# Patient Record
Sex: Female | Born: 1955 | Race: White | Hispanic: No | Smoking: Never smoker
Health system: Southern US, Community
[De-identification: ages and names within clinical notes are randomized; demographics above are authoritative.]

## PROBLEM LIST (undated history)

## (undated) DIAGNOSIS — T7840XA Allergy, unspecified, initial encounter: Secondary | ICD-10-CM

## (undated) DIAGNOSIS — M1990 Unspecified osteoarthritis, unspecified site: Secondary | ICD-10-CM

## (undated) DIAGNOSIS — H269 Unspecified cataract: Secondary | ICD-10-CM

## (undated) DIAGNOSIS — K219 Gastro-esophageal reflux disease without esophagitis: Secondary | ICD-10-CM

## (undated) DIAGNOSIS — I10 Essential (primary) hypertension: Secondary | ICD-10-CM

## (undated) DIAGNOSIS — G709 Myoneural disorder, unspecified: Secondary | ICD-10-CM

## (undated) DIAGNOSIS — E119 Type 2 diabetes mellitus without complications: Secondary | ICD-10-CM

## (undated) DIAGNOSIS — F32A Depression, unspecified: Secondary | ICD-10-CM

## (undated) HISTORY — DX: Myoneural disorder, unspecified: G70.9

## (undated) HISTORY — PX: TOTAL ABDOMINAL HYSTERECTOMY: SHX209

## (undated) HISTORY — DX: Depression, unspecified: F32.A

## (undated) HISTORY — DX: Gastro-esophageal reflux disease without esophagitis: K21.9

## (undated) HISTORY — PX: TUBAL LIGATION: SHX77

## (undated) HISTORY — DX: Unspecified cataract: H26.9

## (undated) HISTORY — PX: CHOLECYSTECTOMY: SHX55

## (undated) HISTORY — PX: CATARACT EXTRACTION: SUR2

## (undated) HISTORY — DX: Unspecified osteoarthritis, unspecified site: M19.90

## (undated) HISTORY — DX: Allergy, unspecified, initial encounter: T78.40XA

## (undated) HISTORY — PX: ABDOMINAL HYSTERECTOMY: SHX81

---

## 2012-10-13 ENCOUNTER — Emergency Department (HOSPITAL_COMMUNITY)
Admission: EM | Admit: 2012-10-13 | Discharge: 2012-10-13 | Disposition: A | Discharge status: Home / Self Care | Payer: No Typology Code available for payment source | Attending: Emergency Medicine | Admitting: Emergency Medicine

## 2012-10-13 ENCOUNTER — Emergency Department (HOSPITAL_COMMUNITY): Payer: No Typology Code available for payment source

## 2012-10-13 ENCOUNTER — Encounter (HOSPITAL_COMMUNITY): Payer: Self-pay | Admitting: Cardiology

## 2012-10-13 DIAGNOSIS — I10 Essential (primary) hypertension: Secondary | ICD-10-CM | POA: Insufficient documentation

## 2012-10-13 DIAGNOSIS — IMO0002 Reserved for concepts with insufficient information to code with codable children: Secondary | ICD-10-CM | POA: Insufficient documentation

## 2012-10-13 DIAGNOSIS — Z79899 Other long term (current) drug therapy: Secondary | ICD-10-CM | POA: Insufficient documentation

## 2012-10-13 DIAGNOSIS — E119 Type 2 diabetes mellitus without complications: Secondary | ICD-10-CM | POA: Insufficient documentation

## 2012-10-13 DIAGNOSIS — S62501A Fracture of unspecified phalanx of right thumb, initial encounter for closed fracture: Secondary | ICD-10-CM

## 2012-10-13 DIAGNOSIS — Y9389 Activity, other specified: Secondary | ICD-10-CM | POA: Insufficient documentation

## 2012-10-13 DIAGNOSIS — S3981XA Other specified injuries of abdomen, initial encounter: Secondary | ICD-10-CM | POA: Insufficient documentation

## 2012-10-13 DIAGNOSIS — S62609A Fracture of unspecified phalanx of unspecified finger, initial encounter for closed fracture: Secondary | ICD-10-CM | POA: Insufficient documentation

## 2012-10-13 DIAGNOSIS — Y92410 Unspecified street and highway as the place of occurrence of the external cause: Secondary | ICD-10-CM | POA: Insufficient documentation

## 2012-10-13 HISTORY — DX: Type 2 diabetes mellitus without complications: E11.9

## 2012-10-13 HISTORY — DX: Essential (primary) hypertension: I10

## 2012-10-13 LAB — CBC WITH DIFFERENTIAL/PLATELET
Basophils Absolute: 0 10*3/uL (ref 0.0–0.1)
Basophils Relative: 0 % (ref 0–1)
MCHC: 34.4 g/dL (ref 30.0–36.0)
Neutro Abs: 8.2 10*3/uL — ABNORMAL HIGH (ref 1.7–7.7)
Neutrophils Relative %: 80 % — ABNORMAL HIGH (ref 43–77)
RDW: 13 % (ref 11.5–15.5)

## 2012-10-13 LAB — BASIC METABOLIC PANEL
Chloride: 104 mEq/L (ref 96–112)
GFR calc Af Amer: >90 mL/min (ref >90)
Potassium: 4.1 mEq/L (ref 3.5–5.1)

## 2012-10-13 MED ORDER — MORPHINE SULFATE 4 MG/ML IJ SOLN
4.0000 mg | Freq: Once | INTRAMUSCULAR | Status: DC
  Filled 2012-10-13 (×2): qty 1

## 2012-10-13 MED ORDER — IOHEXOL 300 MG/ML  SOLN
100.0000 mL | Freq: Once | INTRAMUSCULAR | Status: AC | PRN
  Administered 2012-10-13: 100 mL via INTRAVENOUS

## 2012-10-13 MED ORDER — OXYCODONE-ACETAMINOPHEN 5-325 MG PO TABS: 1.0000 | ORAL_TABLET | ORAL | Status: DC | PRN

## 2012-10-13 MED ORDER — HYDROCODONE-ACETAMINOPHEN 5-325 MG PO TABS
1.0000 | ORAL_TABLET | Freq: Once | ORAL | Status: AC
  Administered 2012-10-13: 1 via ORAL
  Filled 2012-10-13: qty 1
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
## 2012-10-13 NOTE — ED Notes (Signed)
Family at bedside. 
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
## 2012-10-13 NOTE — Progress Notes (Signed)
Orthopedic Tech Progress Note Patient Details:  Alicia Pineda 1956/03/11 [id number]  Ortho Devices Type of Ortho Device: Ace wrap;Thumb spica splint Splint Material: Plaster Ortho Device/Splint Location: RUE Ortho Device/Splint Interventions: Ordered;Application   Jennye Moccasin 10/13/2012, 8:55 PM

## 2012-10-15 NOTE — ED Provider Notes (Signed)
Medical screening examination/treatment/procedure(s) were performed by non-physician practitioner and as supervising physician I was immediately available for consultation/collaboration.  Juliet Rude. Rubin Payor, MD 10/15/12 1400

## 2012-12-07 ENCOUNTER — Ambulatory Visit: Payer: No Typology Code available for payment source | Attending: Orthopedic Surgery | Admitting: Physical Therapy

## 2012-12-07 DIAGNOSIS — R5381 Other malaise: Secondary | ICD-10-CM | POA: Insufficient documentation

## 2012-12-07 DIAGNOSIS — M25549 Pain in joints of unspecified hand: Secondary | ICD-10-CM | POA: Insufficient documentation

## 2012-12-07 DIAGNOSIS — IMO0001 Reserved for inherently not codable concepts without codable children: Secondary | ICD-10-CM | POA: Insufficient documentation

## 2012-12-08 ENCOUNTER — Ambulatory Visit: Payer: No Typology Code available for payment source | Admitting: Physical Therapy

## 2012-12-12 ENCOUNTER — Ambulatory Visit: Payer: No Typology Code available for payment source

## 2012-12-15 ENCOUNTER — Ambulatory Visit: Payer: No Typology Code available for payment source

## 2012-12-19 ENCOUNTER — Ambulatory Visit: Payer: No Typology Code available for payment source

## 2012-12-21 ENCOUNTER — Ambulatory Visit: Payer: No Typology Code available for payment source

## 2012-12-27 ENCOUNTER — Encounter: Payer: Self-pay | Admitting: *Deleted

## 2012-12-29 ENCOUNTER — Encounter: Payer: Self-pay | Admitting: *Deleted

## 2017-11-09 DIAGNOSIS — K819 Cholecystitis, unspecified: Secondary | ICD-10-CM | POA: Insufficient documentation

## 2020-04-06 DIAGNOSIS — U071 COVID-19: Secondary | ICD-10-CM

## 2020-04-06 HISTORY — DX: COVID-19: U07.1

## 2020-09-11 DIAGNOSIS — H524 Presbyopia: Secondary | ICD-10-CM | POA: Diagnosis not present

## 2020-09-11 DIAGNOSIS — E119 Type 2 diabetes mellitus without complications: Secondary | ICD-10-CM | POA: Diagnosis not present

## 2020-09-15 ENCOUNTER — Other Ambulatory Visit: Payer: Self-pay

## 2020-09-15 ENCOUNTER — Emergency Department
Admission: EM | Admit: 2020-09-15 | Discharge: 2020-09-15 | Disposition: A | Discharge status: Home / Self Care | Payer: Self-pay | Source: Home / Self Care

## 2020-09-15 ENCOUNTER — Encounter: Payer: Self-pay | Admitting: Emergency Medicine

## 2020-09-15 DIAGNOSIS — B349 Viral infection, unspecified: Secondary | ICD-10-CM

## 2020-09-15 DIAGNOSIS — J0100 Acute maxillary sinusitis, unspecified: Secondary | ICD-10-CM | POA: Diagnosis not present

## 2020-09-15 DIAGNOSIS — J309 Allergic rhinitis, unspecified: Secondary | ICD-10-CM

## 2020-09-15 DIAGNOSIS — R6889 Other general symptoms and signs: Secondary | ICD-10-CM

## 2020-09-15 DIAGNOSIS — J040 Acute laryngitis: Secondary | ICD-10-CM

## 2020-09-15 LAB — POC SARS CORONAVIRUS 2 AG -  ED: SARS Coronavirus 2 Ag: NEGATIVE

## 2020-09-15 MED ORDER — FEXOFENADINE HCL 180 MG PO TABS: 180.0000 mg | ORAL_TABLET | Freq: Every day | ORAL | 0 refills | Status: DC

## 2020-09-15 MED ORDER — METHYLPREDNISOLONE ACETATE 80 MG/ML IJ SUSP
80.0000 mg | Freq: Once | INTRAMUSCULAR | Status: AC
  Administered 2020-09-15: 80 mg via INTRAMUSCULAR

## 2020-09-15 MED ORDER — AMOXICILLIN-POT CLAVULANATE 875-125 MG PO TABS: 1.0000 | ORAL_TABLET | Freq: Two times a day (BID) | ORAL | 0 refills | Status: DC

## 2020-09-15 MED ORDER — PREDNISONE 20 MG PO TABS: ORAL_TABLET | ORAL | 0 refills | Status: DC

## 2020-09-15 NOTE — Discharge Instructions (Addendum)
Advised/instructed patient to take medication as directed with food to completion.  Advised patient not to start oral prednisone burst until tomorrow, Monday, 09/16/2020.  Advised patient may take Allegra 180 mg daily for the next 5 to 7 days, then as needed.  Advised patient to maintain/increase daily water intake (32-48) ounces daily.

## 2020-09-15 NOTE — ED Provider Notes (Signed)
[facility]    CSN: [id number] Arrival date & time: 09/15/20  1039      History   Chief Complaint Chief Complaint  Patient presents with   Sore Throat   Generalized Body Aches    HPI Alicia Pineda is a 65 y.o. female.   HPI 65 year old female presents with body aches and sore throat for 2 days.  Reports negative home COVID test x2.  Patient has been vaccinated for COVID-19.  Past Medical History:  Diagnosis Date   COVID-19 04/2020   also had in 2020   Diabetes mellitus without complication (HCC)    Hypertension     There are no problems to display for this patient.   Past Surgical History:  Procedure Laterality Date   ABDOMINAL HYSTERECTOMY     2007   CHOLECYSTECTOMY      OB History   No obstetric history on file.      Home Medications    Prior to Admission medications   Medication Sig Start Date End Date Taking? Authorizing Provider  amoxicillin-clavulanate (AUGMENTIN) 875-125 MG tablet Take 1 tablet by mouth every 12 (twelve) hours. 09/15/20  Yes Trevor Iha, FNP  carvedilol (COREG) 25 MG tablet Take 25 mg by mouth 2 (two) times daily with a meal.   Yes [provider]  fexofenadine (ALLEGRA ALLERGY) 180 MG tablet Take 1 tablet (180 mg total) by mouth daily for 15 days. 09/15/20 09/30/20 Yes Trevor Iha, FNP  losartan (COZAAR) 50 MG tablet Take 1 tablet by mouth daily. 06/18/20  Yes [provider]  metFORMIN (GLUCOPHAGE) 500 MG tablet Take 500 mg by mouth 2 (two) times daily with a meal.   Yes [provider]  predniSONE (DELTASONE) 20 MG tablet Take 3 tabs PO daily x 5 days. 09/15/20  Yes Trevor Iha, FNP  amLODipine (NORVASC) 10 MG tablet Take 1 tablet by mouth daily. 08/21/20   [provider]  CALCIUM PO Take 1 tablet by mouth daily. Patient not taking: Reported on 09/15/2020    [provider]  fish oil-omega-3 fatty acids 1000 MG capsule Take 1 g by mouth daily.    [provider]   FLUoxetine (PROZAC) 20 MG capsule Take 20 mg by mouth daily. Patient not taking: Reported on 09/15/2020    [provider]  FLUoxetine (PROZAC) 40 MG capsule Take 40 mg by mouth daily. 08/21/20   [provider]  glipiZIDE (GLUCOTROL) 10 MG tablet Take 10 mg by mouth 2 (two) times daily. 08/21/20   [provider]  hydrochlorothiazide (HYDRODIURIL) 25 MG tablet Take by mouth.    [provider]  losartan (COZAAR) 25 MG tablet Take 25 mg by mouth daily. Patient not taking: Reported on 09/15/2020    [provider]  oxyCODONE-acetaminophen (PERCOCET/ROXICET) 5-325 MG per tablet Take 1 tablet by mouth every 4 (four) hours as needed for pain. Patient not taking: Reported on 09/15/2020 10/13/12   Garlon Hatchet, PA-C    Family History Family History  Problem Relation Age of Onset   Heart failure Mother    Diabetes Mother    Heart failure Father    Diabetes Father    Diabetes Sister    Heart failure Brother     Social History Social History   Tobacco Use   Smoking status: Never  Substance Use Topics   Alcohol use: No   Drug use: No     Allergies   Patient has no known allergies.   Review of  Systems Review of Systems  Constitutional:  Positive for fever.  HENT:  Positive for congestion, postnasal drip, sore throat and voice change.   Eyes: Negative.   Respiratory:  Positive for cough.   Cardiovascular: Negative.   Gastrointestinal: Negative.   Genitourinary: Negative.   Musculoskeletal:  Positive for myalgias.  Skin: Negative.   Neurological: Negative.     Physical Exam Triage Vital Signs ED Triage Vitals  Enc Vitals Group     BP 09/15/20 1107 123/74     Pulse Rate 09/15/20 1107 69     Resp 09/15/20 1107 18     Temp 09/15/20 1107 99.1 F (37.3 C)     Temp Source 09/15/20 1107 Oral     SpO2 09/15/20 1107 96 %     Weight 09/15/20 1109 250 lb (113.4 kg)     Height 09/15/20 1109 5\' 5"  (1.651 m)     Head Circumference --       Peak Flow --      Pain Score 09/15/20 1109 4     Pain Loc --      Pain Edu? --      Excl. in GC? --    No data found.  Updated Vital Signs BP 123/74 (BP Location: Right Arm)   Pulse 69   Temp 99.1 F (37.3 C) (Oral)   Resp 18   Ht 5\' 5"  (1.651 m)   Wt 250 lb (113.4 kg)   SpO2 96%   BMI 41.60 kg/m   Physical Exam Constitutional:      General: She is not in acute distress.    Appearance: She is well-developed. She is ill-appearing.  HENT:     Head: Normocephalic and atraumatic.     Right Ear: Tympanic membrane and ear canal normal.     Left Ear: Tympanic membrane and ear canal normal.     Ears:     Comments: Eustachian tube dysfunction noted bilaterally    Nose: Congestion and rhinorrhea present.     Comments: Turbinates are erythematous with scant mucopurulent rhinorrhea noted    Mouth/Throat:     Mouth: Mucous membranes are moist. No oral lesions.     Pharynx: Oropharynx is clear. Uvula midline. No posterior oropharyngeal erythema.     Comments: Moderate amount of clear drainage of posterior oropharynx noted Eyes:     Conjunctiva/sclera: Conjunctivae normal.     Pupils: Pupils are equal, round, and reactive to light.  Cardiovascular:     Rate and Rhythm: Normal rate and regular rhythm.     Heart sounds: Normal heart sounds. No murmur heard. Pulmonary:     Effort: Pulmonary effort is normal. No respiratory distress.     Breath sounds: Normal breath sounds. No wheezing, rhonchi or rales.  Musculoskeletal:     Cervical back: Normal range of motion and neck supple.  Lymphadenopathy:     Cervical: Cervical adenopathy present.  Skin:    General: Skin is warm and dry.  Neurological:     General: No focal deficit present.     Mental Status: She is alert and oriented to person, place, and time.  Psychiatric:        Mood and Affect: Mood normal.        Behavior: Behavior normal.     UC Treatments / Results  Labs (all labs ordered are listed, but only abnormal  results are displayed) Labs Reviewed  POC SARS CORONAVIRUS 2 AG -  ED    EKG   Radiology No  results found.  Procedures Procedures (including critical care time)  Medications Ordered in UC Medications  methylPREDNISolone acetate (DEPO-MEDROL) injection 80 mg (80 mg Intramuscular Given 09/15/20 1209)    Initial Impression / Assessment and Plan / UC Course  I have reviewed the triage vital signs and the nursing notes.  Pertinent labs & imaging results that were available during my care of the patient were reviewed by me and considered in my medical decision making (see chart for details).     MDM: 1.  Flulike symptoms, 2.  Viral illness.  3.  Laryngitis, 4. subacute maxillary sinusitis, 5.  Allergic rhinitis.  Patient discharged home, hemodynamically stable. Final Clinical Impressions(s) / UC Diagnoses   Final diagnoses:  Flu-like symptoms  Viral illness  Subacute maxillary sinusitis  Allergic rhinitis, unspecified seasonality, unspecified trigger  Laryngitis     Discharge Instructions      Advised/instructed patient to take medication as directed with food to completion.  Advised patient not to start oral prednisone burst until tomorrow, Monday, 09/16/2020.  Advised patient may take Allegra 180 mg daily for the next 5 to 7 days, then as needed.  Advised patient to maintain/increase daily water intake (32-48) ounces daily.     ED Prescriptions     Medication Sig Dispense Auth. Provider   amoxicillin-clavulanate (AUGMENTIN) 875-125 MG tablet Take 1 tablet by mouth every 12 (twelve) hours. 14 tablet Trevor Iha, FNP   predniSONE (DELTASONE) 20 MG tablet Take 3 tabs PO daily x 5 days. 15 tablet Trevor Iha, FNP   fexofenadine [facility] ALLERGY) 180 MG tablet Take 1 tablet (180 mg total) by mouth daily for 15 days. 15 tablet Trevor Iha, FNP      PDMP not reviewed this encounter.   Trevor Iha, FNP 09/15/20 1232

## 2020-09-15 NOTE — ED Triage Notes (Signed)
Body aches  & sore throat since Friday  Negative home COVID test  COVID x 2  COVID vaccine x 2 - no booster OTC mucinex, tylenol, motrin  Last motrin at 0530

## 2020-10-16 DIAGNOSIS — E1136 Type 2 diabetes mellitus with diabetic cataract: Secondary | ICD-10-CM | POA: Diagnosis not present

## 2020-10-16 DIAGNOSIS — Z79899 Other long term (current) drug therapy: Secondary | ICD-10-CM | POA: Diagnosis not present

## 2020-10-16 DIAGNOSIS — H2513 Age-related nuclear cataract, bilateral: Secondary | ICD-10-CM | POA: Diagnosis not present

## 2020-10-16 DIAGNOSIS — H25813 Combined forms of age-related cataract, bilateral: Secondary | ICD-10-CM | POA: Diagnosis not present

## 2020-10-16 DIAGNOSIS — Z7984 Long term (current) use of oral hypoglycemic drugs: Secondary | ICD-10-CM | POA: Diagnosis not present

## 2020-10-17 DIAGNOSIS — H2513 Age-related nuclear cataract, bilateral: Secondary | ICD-10-CM | POA: Insufficient documentation

## 2020-12-04 ENCOUNTER — Encounter: Payer: Self-pay | Admitting: Medical-Surgical

## 2020-12-04 ENCOUNTER — Ambulatory Visit: Payer: 59 | Admitting: Medical-Surgical

## 2020-12-04 ENCOUNTER — Other Ambulatory Visit: Payer: Self-pay

## 2020-12-04 VITALS — BP 91/56 | HR 57 | Temp 98.0°F | Ht 63.0 in | Wt 249.0 lb

## 2020-12-04 DIAGNOSIS — Z0000 Encounter for general adult medical examination without abnormal findings: Secondary | ICD-10-CM | POA: Diagnosis not present

## 2020-12-04 DIAGNOSIS — Z1231 Encounter for screening mammogram for malignant neoplasm of breast: Secondary | ICD-10-CM

## 2020-12-04 DIAGNOSIS — Z114 Encounter for screening for human immunodeficiency virus [HIV]: Secondary | ICD-10-CM

## 2020-12-04 DIAGNOSIS — Z1329 Encounter for screening for other suspected endocrine disorder: Secondary | ICD-10-CM | POA: Diagnosis not present

## 2020-12-04 DIAGNOSIS — I10 Essential (primary) hypertension: Secondary | ICD-10-CM

## 2020-12-04 DIAGNOSIS — Z7689 Persons encountering health services in other specified circumstances: Secondary | ICD-10-CM | POA: Diagnosis not present

## 2020-12-04 DIAGNOSIS — Z1159 Encounter for screening for other viral diseases: Secondary | ICD-10-CM

## 2020-12-04 DIAGNOSIS — Z23 Encounter for immunization: Secondary | ICD-10-CM | POA: Diagnosis not present

## 2020-12-04 DIAGNOSIS — Z1211 Encounter for screening for malignant neoplasm of colon: Secondary | ICD-10-CM

## 2020-12-04 DIAGNOSIS — E1165 Type 2 diabetes mellitus with hyperglycemia: Secondary | ICD-10-CM

## 2020-12-04 LAB — POCT GLYCOSYLATED HEMOGLOBIN (HGB A1C): Hemoglobin A1C: 7.8 % — AB (ref 4.0–5.6)

## 2020-12-04 NOTE — Patient Instructions (Signed)
Influenza (Flu) Vaccine (Inactivated or Recombinant): What You Need to Know 1. Why get vaccinated? Influenza vaccine can prevent influenza (flu). Flu is a contagious disease that spreads around the [place] every year, usually between October and May. Anyone can get the flu, but it is more dangerous for some people. Infants and young children, people 65 years and older, pregnant people, and people with certain health conditions or a weakened immune system are at greatest risk of flu complications. Pneumonia, bronchitis, sinus infections, and ear infections are examples of flu-related complications. If you have a medical condition, such as heart disease, cancer, or diabetes, flu can make it worse. Flu can cause fever and chills, sore throat, muscle aches, fatigue, cough, headache, and runny or stuffy nose. Some people may have vomiting and diarrhea, though this is more common in children than adults. In an average year, thousands of people in the [place] die from flu, and many more are hospitalized. Flu vaccine prevents millions of illnesses and flu-related visits to the doctor each year. 2. Influenza vaccines CDC recommends everyone 6 months and older get vaccinated every flu season. Children 6 months through 8 years of age may need 2 doses during a single flu season. Everyone else needs only 1 dose each flu season. It takes about 2 weeks for protection to develop after vaccination. There are many flu viruses, and they are always changing. Each year a new flu vaccine is made to protect against the influenza viruses believed to be likely to cause disease in the upcoming flu season. Even when the vaccine doesn't exactly match these viruses, it may still provide some protection. Influenza vaccine does not cause flu. Influenza vaccine may be given at the same time as other vaccines. 3. Talk with your health care provider Tell your vaccination provider if the person getting the vaccine: Has had  an allergic reaction after a previous dose of influenza vaccine, or has any severe, life-threatening allergies Has ever had Guillain-Barr Syndrome (also called "GBS") In some cases, your health care provider may decide to postpone influenza vaccination until a future visit. Influenza vaccine can be administered at any time during pregnancy. People who are or will be pregnant during influenza season should receive inactivated influenza vaccine. People with minor illnesses, such as a cold, may be vaccinated. People who are moderately or severely ill should usually wait until they recover before getting influenza vaccine. Your health care provider can give you more information. 4. Risks of a vaccine reaction Soreness, redness, and swelling where the shot is given, fever, muscle aches, and headache can happen after influenza vaccination. There may be a very small increased risk of Guillain-Barr Syndrome (GBS) after inactivated influenza vaccine (the flu shot). Young children who get the flu shot along with pneumococcal vaccine (PCV13) and/or DTaP vaccine at the same time might be slightly more likely to have a seizure caused by fever. Tell your health care provider if a child who is getting flu vaccine has ever had a seizure. People sometimes faint after medical procedures, including vaccination. Tell your provider if you feel dizzy or have vision changes or ringing in the ears. As with any medicine, there is a very remote chance of a vaccine causing a severe allergic reaction, other serious injury, or death. 5. What if there is a serious problem? An allergic reaction could occur after the vaccinated person leaves the clinic. If you see signs of a severe allergic reaction (hives, swelling of the face and throat, difficulty breathing,   a fast heartbeat, dizziness, or weakness), call 9-1-1 and get the person to the nearest hospital. For other signs that concern you, call your health care provider. Adverse  reactions should be reported to the Vaccine Adverse Event Reporting System (VAERS). Your health care provider will usually file this report, or you can do it yourself. Visit the VAERS website at www.vaers.hhs.gov or call 1-800-822-7967. VAERS is only for reporting reactions, and VAERS staff members do not give medical advice. 6. The National Vaccine Injury Compensation Program The National Vaccine Injury Compensation Program (VICP) is a federal program that was created to compensate people who may have been injured by certain vaccines. Claims regarding alleged injury or death due to vaccination have a time limit for filing, which may be as short as two years. Visit the VICP website at www.hrsa.gov/vaccinecompensation or call 1-800-338-2382 to learn about the program and about filing a claim. 7. How can I learn more? Ask your health care provider. Call your local or state health department. Visit the website of the Food and Drug Administration (FDA) for vaccine package inserts and additional information at www.fda.gov/vaccines-blood-biologics/vaccines. Contact the Centers for Disease Control and Prevention (CDC): Call 1-800-232-4636 (1-800-CDC-INFO) or Visit CDC's website at www.cdc.gov/flu. Vaccine Information Statement Inactivated Influenza Vaccine (11/10/2019) This information is not intended to replace advice given to you by your health care provider. Make sure you discuss any questions you have with your health care provider. Document Revised: 12/28/2019 Document Reviewed: 12/28/2019 Elsevier Patient Education  2022 Else[employer] Inc.   Tdap (Tetanus, Diphtheria, Pertussis) Vaccine: What You Need to Know 1. Why get vaccinated? Tdap vaccine can prevent tetanus, diphtheria, and pertussis. Diphtheria and pertussis spread from person to person. Tetanus enters the body through cuts or wounds. TETANUS (T) causes painful stiffening of the muscles. Tetanus can lead to serious health problems, including  being unable to open the mouth, having trouble swallowing and breathing, or death. DIPHTHERIA (D) can lead to difficulty breathing, heart failure, paralysis, or death. PERTUSSIS (aP), also known as "whooping cough," can cause uncontrollable, violent coughing that makes it hard to breathe, eat, or drink. Pertussis can be extremely serious especially in babies and young children, causing pneumonia, convulsions, brain damage, or death. In teens and adults, it can cause weight loss, loss of bladder control, passing out, and rib fractures from severe coughing. 2. Tdap vaccine Tdap is only for children 7 years and older, adolescents, and adults.  Adolescents should receive a single dose of Tdap, preferably at age 11 or 12 years. Pregnant people should get a dose of Tdap during every pregnancy, preferably during the early part of the third trimester, to help protect the newborn from pertussis. Infants are most at risk for severe, life-threatening complications from pertussis. Adults who have never received Tdap should get a dose of Tdap. Also, adults should receive a booster dose of either Tdap or Td (a different vaccine that protects against tetanus and diphtheria but not pertussis) every 10 years, or after 5 years in the case of a severe or dirty wound or burn. Tdap may be given at the same time as other vaccines. 3. Talk with your health care provider Tell your vaccine provider if the person getting the vaccine: Has had an allergic reaction after a previous dose of any vaccine that protects against tetanus, diphtheria, or pertussis, or has any severe, life-threatening allergies Has had a coma, decreased level of consciousness, or prolonged seizures within 7 days after a previous dose of any pertussis vaccine (DTP, DTaP,   or Tdap) Has seizures or another nervous system problem Has ever had Guillain-Barr Syndrome (also called "GBS") Has had severe pain or swelling after a previous dose of any vaccine that  protects against tetanus or diphtheria In some cases, your health care provider may decide to postpone Tdap vaccination until a future visit. People with minor illnesses, such as a cold, may be vaccinated. People who are moderately or severely ill should usually wait until they recover before getting Tdap vaccine.  Your health care provider can give you more information. 4. Risks of a vaccine reaction Pain, redness, or swelling where the shot was given, mild fever, headache, feeling tired, and nausea, vomiting, diarrhea, or stomachache sometimes happen after Tdap vaccination. People sometimes faint after medical procedures, including vaccination. Tell your provider if you feel dizzy or have vision changes or ringing in the ears.  As with any medicine, there is a very remote chance of a vaccine causing a severe allergic reaction, other serious injury, or death. 5. What if there is a serious problem? An allergic reaction could occur after the vaccinated person leaves the clinic. If you see signs of a severe allergic reaction (hives, swelling of the face and throat, difficulty breathing, a fast heartbeat, dizziness, or weakness), call 9-1-1 and get the person to the nearest hospital. For other signs that concern you, call your health care provider.  Adverse reactions should be reported to the Vaccine Adverse Event Reporting System (VAERS). Your health care provider will usually file this report, or you can do it yourself. Visit the VAERS website at www.vaers.[place]gents.no or call 970 183 1023. VAERS is only for reporting reactions, and VAERS staff members do not give medical advice. 6. The National Vaccine Injury Compensation Program The [employer] Vaccine Injury Compensation Program (VICP) is a federal program that was created to compensate people who may have been injured by certain vaccines. Claims regarding alleged injury or death due to vaccination have a time limit for filing, which may be as short as two  years. Visit the VICP website at SpiritualWord.at or call 2258081470 to learn about the program and about filing a claim. 7. How can I learn more? Ask your health care provider. Call your local or state health department. Visit the website of the Food and Drug Administration (FDA) for vaccine package inserts and additional information at FinderList.no. Contact the Centers for Disease Control and Prevention (CDC): Call (984)883-2141 (1-800-CDC-INFO) or Visit CDC's website at PicCapture.uy. Vaccine Information Statement Tdap (Tetanus, Diphtheria, Pertussis) Vaccine (11/10/2019) This information is not intended to replace advice given to you by your health care provider. Make sure you discuss any questions you have with your health care provider. Document Revised: 12/06/2019 Document Reviewed: 12/06/2019 Elsevier Patient Education  2022 Else[employer] Inc.   Recombinant Zoster (Shingles) Vaccine: What You Need to Know 1. Why get vaccinated? Recombinant zoster (shingles) vaccine can prevent shingles. Shingles (also called herpes zoster, or just zoster) is a painful skin rash, usually with blisters. In addition to the rash, shingles can cause fever, headache, chills, or upset stomach. Rarely, shingles can lead to complications such as pneumonia, hearing problems, blindness, brain inflammation (encephalitis), or death. The risk of shingles increases with age. The most common complication of shingles is long-term nerve pain called postherpetic neuralgia (PHN). PHN occurs in the areas where the shingles rash was and can last for months or years after the rash goes away. The pain from PHN can be severe and debilitating. The risk of PHN increases with age. An older  adult with shingles is more likely to develop PHN and have longer lasting and more severe pain than a younger person. People with weakened immune systems also have a higher risk of getting  shingles and complications from the disease. Shingles is caused by varicella-zoster virus, the same virus that causes chickenpox. After you have chickenpox, the virus stays in your body and can cause shingles later in life. Shingles cannot be passed from one person to another, but the virus that causes shingles can spread and cause chickenpox in someone who has never had chickenpox or has never received chickenpox vaccine. 2. Recombinant shingles vaccine Recombinant shingles vaccine provides strong protection against shingles. By preventing shingles, recombinant shingles vaccine also protects against PHN and other complications. Recombinant shingles vaccine is recommended for: Adults 50 years and older Adults 19 years and older who have a weakened immune system because of disease or treatments Shingles vaccine is given as a two-dose series. For most people, the second dose should be given 2 to 6 months after the first dose. Some people who have or will have a weakened immune system can get the second dose 1 to 2 months after the first dose. Ask your health care provider for guidance. People who have had shingles in the past and people who have received varicella (chickenpox) vaccine are recommended to get recombinant shingles vaccine. The vaccine is also recommended for people who have already gotten another type of shingles vaccine, the live shingles vaccine. There is no live virus in recombinant shingles vaccine. Shingles vaccine may be given at the same time as other vaccines. 3. Talk with your health care provider Tell your vaccination provider if the person getting the vaccine: Has had an allergic reaction after a previous dose of recombinant shingles vaccine, or has any severe, life-threatening allergies Is currently experiencing an episode of shingles Is pregnant In some cases, your health care provider may decide to postpone shingles vaccination until a future visit. People with minor  illnesses, such as a cold, may be vaccinated. People who are moderately or severely ill should usually wait until they recover before getting recombinant shingles vaccine. Your health care provider can give you more information. 4. Risks of a vaccine reaction A sore arm with mild or moderate pain is very common after recombinant shingles vaccine. Redness and swelling can also happen at the site of the injection. Tiredness, muscle pain, headache, shivering, fever, stomach pain, and nausea are common after recombinant shingles vaccine. These side effects may temporarily prevent a vaccinated person from doing regular activities. Symptoms usually go away on their own in 2 to 3 days. You should still get the second dose of recombinant shingles vaccine even if you had one of these reactions after the first dose. Guillain-Barr syndrome (GBS), a serious nervous system disorder, has been reported very rarely after recombinant zoster vaccine. People sometimes faint after medical procedures, including vaccination. Tell your provider if you feel dizzy or have vision changes or ringing in the ears. As with any medicine, there is a very remote chance of a vaccine causing a severe allergic reaction, other serious injury, or death. 5. What if there is a serious problem? An allergic reaction could occur after the vaccinated person leaves the clinic. If you see signs of a severe allergic reaction (hives, swelling of the face and throat, difficulty breathing, a fast heartbeat, dizziness, or weakness), call 9-1-1 and get the person to the nearest hospital. For other signs that concern you, call your health care  provider. Adverse reactions should be reported to the Vaccine Adverse Event Reporting System (VAERS). Your health care provider will usually file this report, or you can do it yourself. Visit the VAERS website at www.vaers.[place].nohhs.gov or call 857-415-64041-279-652-2266. VAERS is only for reporting reactions, and VAERS staff members  do not give medical advice. 6. How can I learn more? Ask your health care provider. Call your local or state health department. Visit the website of the Food and Drug Administration (FDA) for vaccine package inserts and additional information at GoldCloset.com.eewww.fda.gov/vaccinesblood-biologics/vaccines. Contact the Centers for Disease Control and Prevention (CDC): Call (609)322-09521-279 716 2090 (1-800-CDC-INFO) or Visit CDC's website at PicCapture.uywww.cdc.gov/vaccines. Vaccine Information Statement Recombinant Zoster Vaccine (05/10/2020) This information is not intended to replace advice given to you by your health care provider. Make sure you discuss any questions you have with your health care provider. Document Revised: 05/24/2020 Document Reviewed: 05/24/2020 Elsevier Patient Education  2022 Else[employer] Inc.   Pneumococcal Conjugate Vaccine: What You Need to Know 1. Why get vaccinated? Pneumococcal conjugate vaccine can prevent pneumococcal disease. Pneumococcal disease refers to any illness caused by pneumococcal bacteria. These bacteria can cause many types of illnesses, including pneumonia, which is an infection of the lungs. Pneumococcal bacteria are one of the most common causes of pneumonia. Besides pneumonia, pneumococcal bacteria can also cause: Ear infections Sinus infections Meningitis (infection of the tissue covering the brain and spinal cord) Bacteremia (infection of the blood) Anyone can get pneumococcal disease, but children under 63 years old, people with certain medical conditions or other risk factors, and adults 65 years or older are at the highest risk. Most pneumococcal infections are mild. However, some can result in long-term problems, such as brain damage or hearing loss. Meningitis, bacteremia, and pneumonia caused by pneumococcal disease can be fatal. 2. Pneumococcal conjugate vaccine Pneumococcal conjugate vaccine helps protect against bacteria that cause pneumococcal disease. There are three  pneumococcal conjugate vaccines (PCV13, PCV15, and PCV20). The different vaccines are recommended for different people based on their age and medical status. PCV13 Infants and young children usually need 4 doses of PCV13, at ages 332, 744, 276, and 12-15 months. Older children (through age 63 months) may be vaccinated with PCV13 if they did not receive the recommended doses. Children and adolescents 476-518 years of age with certain medical conditions should receive a single dose of PCV13 if they did not already receive PCV13. PCV15 or PCV20 Adults 4219 through 63 years old with certain medical conditions or other risk factors who have not already received a pneumococcal conjugate vaccine should receive either: a single dose of PCV15 followed by a dose of pneumococcal polysaccharide vaccine (PPSV23), or a single dose of PCV20. Adults 65 years or older who have not already received a pneumococcal conjugate vaccine should receive either: a single dose of PCV15 followed by a dose of PPSV23, or a single dose of PCV20. Your health care provider can give you more information. 3. Talk with your health care provider Tell your vaccination provider if the person getting the vaccine: Has had an allergic reaction after a previous dose of any type of pneumococcal conjugate vaccine (PCV13, PCV15, PCV20, or an earlier pneumococcal conjugate vaccine known as PCV7), or to any vaccine containing diphtheria toxoid (for example, DTaP), or has any severe, life-threatening allergies In some cases, your health care provider may decide to postpone pneumococcal conjugate vaccination until a future visit. People with minor illnesses, such as a cold, may be vaccinated. People who are moderately or severely ill should usually  wait until they recover. Your health care provider can give you more information. 4. Risks of a vaccine reaction Redness, swelling, pain, or tenderness where the shot is given, and fever, loss of appetite,  fussiness (irritability), feeling tired, headache, muscle aches, joint pain, and chills can happen after pneumococcal conjugate vaccination. Young children may be at increased risk for seizures caused by fever after PCV13 if it is administered at the same time as inactivated influenza vaccine. Ask your health care provider for more information. People sometimes faint after medical procedures, including vaccination. Tell your provider if you feel dizzy or have vision changes or ringing in the ears. As with any medicine, there is a very remote chance of a vaccine causing a severe allergic reaction, other serious injury, or death. 5. What if there is a serious problem? An allergic reaction could occur after the vaccinated person leaves the clinic. If you see signs of a severe allergic reaction (hives, swelling of the face and throat, difficulty breathing, a fast heartbeat, dizziness, or weakness), call 9-1-1 and get the person to the nearest hospital. For other signs that concern you, call your health care provider. Adverse reactions should be reported to the Vaccine Adverse Event Reporting System (VAERS). Your health care provider will usually file this report, or you can do it yourself. Visit the VAERS website at www.vaers.[place].no or call 825-687-5802. VAERS is only for reporting reactions, and VAERS staff members do not give medical advice. 6. The National Vaccine Injury Compensation Program The [employer] Vaccine Injury Compensation Program (VICP) is a federal program that was created to compensate people who may have been injured by certain vaccines. Claims regarding alleged injury or death due to vaccination have a time limit for filing, which may be as short as two years. Visit the VICP website at SpiritualWord.at or call 212-039-7861 to learn about the program and about filing a claim. 7. How can I learn more? Ask your health care provider. Call your local or state health  department. Visit the website of the Food and Drug Administration (FDA) for vaccine package inserts and additional information at FinderList.no. Contact the Centers for Disease Control and Prevention (CDC): Call 213-875-8482 (1-800-CDC-INFO) or Visit CDC's website at PicCapture.uy. Vaccine Information Statement (Interim) Pneumococcal Conjugate Vaccine (05/10/2020) This information is not intended to replace advice given to you by your health care provider. Make sure you discuss any questions you have with your health care provider. Document Revised: 05/24/2020 Document Reviewed: 05/24/2020 Elsevier Patient Education  2022 [employer].

## 2020-12-04 NOTE — Progress Notes (Signed)
New Patient Office Visit  Subjective:  Patient ID: Alicia Pineda, female    DOB: [birth year]  Age: 65 y.o. MRN: [id number]  CC:  Chief Complaint  Patient presents with   Establish Care    HPI Alicia Pineda presents to establish care.  She is a very pleasant 65 year old female who has unfortunately been uninsured for several years.  She now has a full-time job and Training and development officer and would like to get established so she can get caught up on preventative care, chronic disease management, and medication refills. Has been getting her medications from the [facility] in [place].   Diabetes-she does check her sugars at home a couple of times per week.  Her lowest reading has been 159 and her highest 200, fasting sugars. She is taking metformin 500mg  BID, Glipizide 10mg  BID, and Farxiga 10mg  daily. Tolerating medications well without side effects. Tried a higher dose of metformin but had significant GI side effects. Has had some numbness and tingling in her lower extremities but is not sure if that is neuropathy or related to her chronic low back issues.   HTN- taking Amlodipine 10mg   and losartan 50mg  daily as prescribed. Also taking Coreg BID and HCTZ daily prn. Does not check BP at home but does when she is at work sometimes. Her work readings have been elevated in the 170/60 range. Denies CP, SOB, palpitations, lower extremity edema, dizziness, headaches, or vision changes.  Low back pain- started her new job in March and has to do a lot of standing. Developed burning pain in both thighs that has now changed to a pins/needles sensation. Has a history of chronic low back pain. Denies saddle paresthesias, lower extremity weakness, and new onset incontinence.   Past Medical History:  Diagnosis Date   COVID-19 04/2020   also had in 2020   Diabetes mellitus without complication (HCC)    Hypertension     Past Surgical History:  Procedure Laterality Date   ABDOMINAL HYSTERECTOMY      2007   CHOLECYSTECTOMY      Family History  Problem Relation Age of Onset   Heart failure Mother    Diabetes Mother    Heart failure Father    Diabetes Father    Diabetes Sister    Heart failure Brother     Social History   Socioeconomic History   Marital status: Legally Separated    Spouse name: Not on file   Number of children: Not on file   Years of education: Not on file   Highest education level: Not on file  Occupational History   Not on file  Tobacco Use   Smoking status: Never   Smokeless tobacco: Never  Substance and Sexual Activity   Alcohol use: Not Currently   Drug use: Never   Sexual activity: Not Currently  Other Topics Concern   Not on file  Social History Narrative   Not on file   Social Determinants of Health   Financial Resource Strain: Not on file  Food Insecurity: Not on file  Transportation Needs: Not on file  Physical Activity: Not on file  Stress: Not on file  Social Connections: Not on file  Intimate Partner Violence: Not on file    ROS Review of Systems  Constitutional:  Negative for chills, fatigue, fever and unexpected weight change.  HENT:  Negative for congestion, rhinorrhea, sinus pressure and sore throat.   Respiratory:  Negative for cough, chest tightness and shortness of breath.  Cardiovascular:  Negative for chest pain, palpitations and leg swelling.  Gastrointestinal:  Negative for abdominal pain, constipation, diarrhea, nausea and vomiting.  Endocrine: Negative for cold intolerance and heat intolerance.  Genitourinary:  Negative for dysuria, frequency and urgency.  Musculoskeletal:  Positive for back pain and myalgias.  Skin:  Negative for rash and wound.  Neurological:  Positive for numbness. Negative for dizziness, light-headedness and headaches.  Hematological:  Does not bruise/bleed easily.  Psychiatric/Behavioral:  Negative for self-injury, sleep disturbance and suicidal ideas. The patient is not nervous/anxious.     Objective:   Today's Vitals: BP (!) 91/56   Pulse (!) 57   Temp 98 F (36.7 C)   Ht 5\' 3"  (1.6 m)   Wt 249 lb (112.9 kg)   SpO2 98%   BMI 44.11 kg/m   Physical Exam Vitals reviewed.  Constitutional:      General: She is not in acute distress.    Appearance: Normal appearance. She is obese. She is not ill-appearing.  HENT:     Head: Normocephalic and atraumatic.  Cardiovascular:     Rate and Rhythm: Normal rate and regular rhythm.     Pulses: Normal pulses.     Heart sounds: Normal heart sounds. No murmur heard.   No friction rub. No gallop.  Pulmonary:     Effort: Pulmonary effort is normal. No respiratory distress.     Breath sounds: Normal breath sounds. No wheezing.  Skin:    General: Skin is warm and dry.  Neurological:     Mental Status: She is alert and oriented to person, place, and time.  Psychiatric:        Mood and Affect: Mood normal.        Behavior: Behavior normal.        Thought Content: Thought content normal.        Judgment: Judgment normal.    Assessment & Plan:   1. Encounter to establish care Reviewed available information and discussed care concerns with patient.   2. Type 2 diabetes mellitus with hyperglycemia, without long-term current use of insulin (HCC) POCT hemoglobin A1c at 7.8% today down from above 9% per patient report. Continue Metformin, Glipizide, and Farxiga as prescribed. Work on weight loss and dietary changes to avoid concentrated sweets and simple carbohydrates. Consider starting Rybelsus or Ozempic to help with glucose control and weight loss.  - POCT glycosylated hemoglobin (Hb A1C)  3. Essential hypertension BP low today but readings at work elevated. Recommend getting a BP cuff that measures on the arm and monitoring BP at home. Continue Amlodipine, Coreg, and Losartan as prescribed but if BPs are lower than 100/60, return for further evaluation.   4. Healthcare maintenance Checking CBC w/diff, CMP, and Lipid panel  today.  - CBC with Differential/Platelet - COMPLETE METABOLIC PANEL WITH GFR - Lipid panel  5. Screening for endocrine disorder Checking  TSH.  - TSH  6. Screen for colon cancer Referring to GI.  - Ambulatory referral to Gastroenterology  7. Encounter for screening mammogram for malignant neoplasm of breast Mammogram ordered.  - MM DIGITAL SCREENING BILATERAL; Future  8. Screening for HIV (human immunodeficiency virus) 9. Need for hepatitis C screening test Discussed screening recommendations. Patient is agreeable so adding to blood work today.  - HIV Antibody (routine testing w rflx) - Hepatitis C antibody  10. Need for Tdap vaccination Tdap given in office.  - Tdap vaccine greater than or equal to 7yo IM  11. Need for influenza vaccination  Flu vaccine given in office.  - Flu Vaccine QUAD 23mo+IM (Fluarix, Fluzone & Alfiuria Quad PF)  12. Need for shingles vaccine Shingles vaccine #1 given in office. Due for next vaccine in 2-6 months.  - Varicella-zoster vaccine IM  13. Need for pneumococcal vaccine Pneumonia vaccine given today.  - Pneumococcal conjugate vaccine 20-valent  Outpatient Encounter Medications as of 12/04/2020  Medication Sig   amLODipine (NORVASC) 10 MG tablet Take 1 tablet by mouth daily.   CALCIUM PO Take 1 tablet by mouth daily.   carvedilol (COREG) 25 MG tablet Take 25 mg by mouth 2 (two) times daily with a meal.   dapagliflozin propanediol (FARXIGA) 10 MG TABS tablet Take 1 tablet by mouth daily.   FLUoxetine (PROZAC) 40 MG capsule Take 40 mg by mouth daily.   glipiZIDE (GLUCOTROL) 10 MG tablet Take 10 mg by mouth 2 (two) times daily.   hydrochlorothiazide (HYDRODIURIL) 25 MG tablet Take 25 mg by mouth daily as needed.   losartan (COZAAR) 50 MG tablet Take 1 tablet by mouth daily.   metFORMIN (GLUCOPHAGE) 500 MG tablet Take 500 mg by mouth 2 (two) times daily with a meal.   [DISCONTINUED] glipiZIDE (GLUCOTROL) 5 MG tablet Take 1 tablet by mouth 2  (two) times daily.   [DISCONTINUED] amoxicillin-clavulanate (AUGMENTIN) 875-125 MG tablet Take 1 tablet by mouth every 12 (twelve) hours.   [DISCONTINUED] fexofenadine (ALLEGRA ALLERGY) 180 MG tablet Take 1 tablet (180 mg total) by mouth daily for 15 days.   [DISCONTINUED] fish oil-omega-3 fatty acids 1000 MG capsule Take 1 g by mouth daily.   [DISCONTINUED] FLUoxetine (PROZAC) 20 MG capsule Take 20 mg by mouth daily. (Patient not taking: Reported on 09/15/2020)   [DISCONTINUED] losartan (COZAAR) 25 MG tablet Take 25 mg by mouth daily. (Patient not taking: Reported on 09/15/2020)   [DISCONTINUED] oxyCODONE-acetaminophen (PERCOCET/ROXICET) 5-325 MG per tablet Take 1 tablet by mouth every 4 (four) hours as needed for pain. (Patient not taking: Reported on 09/15/2020)   [DISCONTINUED] predniSONE (DELTASONE) 20 MG tablet Take 3 tabs PO daily x 5 days.   No facility-administered encounter medications on file as of 12/04/2020.    Follow-up: Return in about 3 months (around 03/05/2021) for DM/HTN/HLD follow up.   Thayer Ohm, DNP, APRN, FNP-BC [facility]

## 2020-12-05 LAB — LIPID PANEL
Cholesterol: 166 mg/dL (ref <200)
HDL: 40 mg/dL — ABNORMAL LOW (ref >=50)
Non-HDL Cholesterol (Calc): 126 mg/dL (calc) (ref <130)
Total CHOL/HDL Ratio: 4.2 (calc) (ref <5.0)
Triglycerides: 448 mg/dL — ABNORMAL HIGH (ref <150)

## 2020-12-05 LAB — COMPLETE METABOLIC PANEL WITH GFR
AG Ratio: 1.4 (calc) (ref 1.0–2.5)
ALT: 11 U/L (ref 6–29)
AST: 8 U/L — ABNORMAL LOW (ref 10–35)
Albumin: 3.9 g/dL (ref 3.6–5.1)
Alkaline phosphatase (APISO): 146 U/L (ref 37–153)
BUN/Creatinine Ratio: 42 (calc) — ABNORMAL HIGH (ref 6–22)
BUN: 27 mg/dL — ABNORMAL HIGH (ref 7–25)
CO2: 29 mmol/L (ref 20–32)
Calcium: 9.3 mg/dL (ref 8.6–10.4)
Chloride: 105 mmol/L (ref 98–110)
Creat: 0.65 mg/dL (ref 0.50–1.05)
Globulin: 2.7 g/dL (calc) (ref 1.9–3.7)
Glucose, Bld: 189 mg/dL — ABNORMAL HIGH (ref 65–99)
Potassium: 4.2 mmol/L (ref 3.5–5.3)
Sodium: 143 mmol/L (ref 135–146)
Total Bilirubin: 0.2 mg/dL (ref 0.2–1.2)
Total Protein: 6.6 g/dL (ref 6.1–8.1)
eGFR: 99 mL/min/{1.73_m2} (ref >=60)

## 2020-12-05 LAB — CBC WITH DIFFERENTIAL/PLATELET
Absolute Monocytes: 486 cells/uL (ref 200–950)
Basophils Absolute: 32 cells/uL (ref 0–200)
Basophils Relative: 0.4 %
Eosinophils Absolute: 689 cells/uL — ABNORMAL HIGH (ref 15–500)
Eosinophils Relative: 8.5 %
HCT: 37.3 % (ref 35.0–45.0)
Hemoglobin: 12.2 g/dL (ref 11.7–15.5)
Lymphs Abs: 2009 cells/uL (ref 850–3900)
MCH: 28.9 pg (ref 27.0–33.0)
MCHC: 32.7 g/dL (ref 32.0–36.0)
MCV: 88.4 fL (ref 80.0–100.0)
MPV: 11.4 fL (ref 7.5–12.5)
Monocytes Relative: 6 %
Neutro Abs: 4884 cells/uL (ref 1500–7800)
Neutrophils Relative %: 60.3 %
Platelets: 276 10*3/uL (ref 140–400)
RBC: 4.22 10*6/uL (ref 3.80–5.10)
RDW: 13.4 % (ref 11.0–15.0)
Total Lymphocyte: 24.8 %
WBC: 8.1 10*3/uL (ref 3.8–10.8)

## 2020-12-05 LAB — HIV ANTIBODY (ROUTINE TESTING W REFLEX): HIV 1&2 Ab, 4th Generation: NONREACTIVE

## 2020-12-05 LAB — HEPATITIS C ANTIBODY
Hepatitis C Ab: NONREACTIVE
SIGNAL TO CUT-OFF: 0.03 (ref <1.00)

## 2020-12-05 LAB — TSH: TSH: 2.06 mIU/L (ref 0.40–4.50)

## 2021-01-07 DIAGNOSIS — H2513 Age-related nuclear cataract, bilateral: Secondary | ICD-10-CM | POA: Diagnosis not present

## 2021-01-15 DIAGNOSIS — H269 Unspecified cataract: Secondary | ICD-10-CM | POA: Diagnosis not present

## 2021-01-15 DIAGNOSIS — H2511 Age-related nuclear cataract, right eye: Secondary | ICD-10-CM | POA: Diagnosis not present

## 2021-01-15 DIAGNOSIS — H2513 Age-related nuclear cataract, bilateral: Secondary | ICD-10-CM | POA: Diagnosis not present

## 2021-01-15 DIAGNOSIS — I10 Essential (primary) hypertension: Secondary | ICD-10-CM | POA: Diagnosis not present

## 2021-02-03 DIAGNOSIS — H2512 Age-related nuclear cataract, left eye: Secondary | ICD-10-CM | POA: Diagnosis not present

## 2021-02-03 DIAGNOSIS — H2513 Age-related nuclear cataract, bilateral: Secondary | ICD-10-CM | POA: Diagnosis not present

## 2021-03-01 ENCOUNTER — Other Ambulatory Visit: Payer: Self-pay | Admitting: Medical-Surgical

## 2021-03-04 ENCOUNTER — Other Ambulatory Visit: Payer: Self-pay | Admitting: Medical-Surgical

## 2021-03-04 DIAGNOSIS — Z1231 Encounter for screening mammogram for malignant neoplasm of breast: Secondary | ICD-10-CM

## 2021-03-05 ENCOUNTER — Ambulatory Visit: Payer: 59 | Admitting: Medical-Surgical

## 2021-03-05 ENCOUNTER — Other Ambulatory Visit (HOSPITAL_BASED_OUTPATIENT_CLINIC_OR_DEPARTMENT_OTHER): Payer: Self-pay

## 2021-03-05 ENCOUNTER — Ambulatory Visit (INDEPENDENT_AMBULATORY_CARE_PROVIDER_SITE_OTHER): Payer: 59

## 2021-03-05 ENCOUNTER — Other Ambulatory Visit: Payer: Self-pay

## 2021-03-05 ENCOUNTER — Encounter: Payer: Self-pay | Admitting: Medical-Surgical

## 2021-03-05 VITALS — BP 118/76 | HR 48 | Resp 20 | Ht 63.0 in | Wt 247.6 lb

## 2021-03-05 DIAGNOSIS — Z23 Encounter for immunization: Secondary | ICD-10-CM | POA: Diagnosis not present

## 2021-03-05 DIAGNOSIS — B3731 Acute candidiasis of vulva and vagina: Secondary | ICD-10-CM

## 2021-03-05 DIAGNOSIS — E1165 Type 2 diabetes mellitus with hyperglycemia: Secondary | ICD-10-CM | POA: Diagnosis not present

## 2021-03-05 DIAGNOSIS — E781 Pure hyperglyceridemia: Secondary | ICD-10-CM | POA: Diagnosis not present

## 2021-03-05 DIAGNOSIS — Z1231 Encounter for screening mammogram for malignant neoplasm of breast: Secondary | ICD-10-CM

## 2021-03-05 DIAGNOSIS — I10 Essential (primary) hypertension: Secondary | ICD-10-CM

## 2021-03-05 LAB — POCT GLYCOSYLATED HEMOGLOBIN (HGB A1C): HbA1c, POC (controlled diabetic range): 7.4 % — AB (ref 0.0–7.0)

## 2021-03-05 MED ORDER — CLOTRIMAZOLE 1 % EX CREA
1.0000 "application " | TOPICAL_CREAM | Freq: Two times a day (BID) | CUTANEOUS | 0 refills | Status: DC
  Filled 2021-03-05 – 2021-11-12 (×2): qty 30, 15d supply, fill #0

## 2021-03-05 MED ORDER — DAPAGLIFLOZIN PROPANEDIOL 10 MG PO TABS
10.0000 mg | ORAL_TABLET | Freq: Every day | ORAL | 1 refills | Status: DC
  Filled 2021-03-05: qty 30, 30d supply, fill #0
  Filled 2021-03-05: qty 90, 90d supply, fill #0
  Filled 2021-04-10: qty 30, 30d supply, fill #0
  Filled 2021-04-10: qty 30, 30d supply, fill #1
  Filled 2021-04-11: qty 30, 30d supply, fill #0
  Filled 2021-05-06 – 2021-05-07 (×2): qty 30, 30d supply, fill #1
  Filled 2021-06-11: qty 30, 30d supply, fill #2
  Filled 2021-07-14: qty 30, 30d supply, fill #3
  Filled 2021-08-11: qty 30, 30d supply, fill #4

## 2021-03-05 MED ORDER — FLUCONAZOLE 150 MG PO TABS
150.0000 mg | ORAL_TABLET | Freq: Once | ORAL | 1 refills | Status: AC
  Filled 2021-03-05: qty 2, 2d supply, fill #0

## 2021-03-05 MED ORDER — AMLODIPINE BESYLATE 10 MG PO TABS
10.0000 mg | ORAL_TABLET | Freq: Every day | ORAL | 1 refills | Status: DC
  Filled 2021-03-05 – 2021-04-11 (×3): qty 90, 90d supply, fill #0
  Filled 2021-09-16: qty 90, 90d supply, fill #1

## 2021-03-05 MED ORDER — FLUOXETINE HCL 40 MG PO CAPS
40.0000 mg | ORAL_CAPSULE | Freq: Every day | ORAL | 1 refills | Status: DC
  Filled 2021-03-05 – 2021-05-22 (×2): qty 90, 90d supply, fill #0
  Filled 2021-08-15: qty 90, 90d supply, fill #1

## 2021-03-05 MED ORDER — HYDROCHLOROTHIAZIDE 25 MG PO TABS
25.0000 mg | ORAL_TABLET | Freq: Every day | ORAL | 1 refills | Status: DC | PRN
  Filled 2021-03-05: qty 90, 90d supply, fill #0

## 2021-03-05 MED ORDER — LOSARTAN POTASSIUM 50 MG PO TABS
50.0000 mg | ORAL_TABLET | Freq: Every day | ORAL | 1 refills | Status: DC
  Filled 2021-03-05 – 2021-05-24 (×2): qty 90, 90d supply, fill #0
  Filled 2021-08-15: qty 90, 90d supply, fill #1

## 2021-03-05 MED ORDER — CARVEDILOL 25 MG PO TABS
25.0000 mg | ORAL_TABLET | Freq: Two times a day (BID) | ORAL | 1 refills | Status: DC
  Filled 2021-03-05 – 2021-04-11 (×3): qty 180, 90d supply, fill #0
  Filled 2021-08-15: qty 180, 90d supply, fill #1

## 2021-03-05 MED ORDER — GLIPIZIDE 10 MG PO TABS
10.0000 mg | ORAL_TABLET | Freq: Two times a day (BID) | ORAL | 1 refills | Status: DC
  Filled 2021-03-05 – 2021-05-29 (×2): qty 180, 90d supply, fill #0
  Filled 2021-09-16: qty 180, 90d supply, fill #1

## 2021-03-05 MED ORDER — TIRZEPATIDE 2.5 MG/0.5ML ~~LOC~~ SOAJ
2.5000 mg | SUBCUTANEOUS | 0 refills | Status: DC
  Filled 2021-03-05: qty 2, 28d supply, fill #0

## 2021-03-05 NOTE — Progress Notes (Signed)
  HPI with pertinent ROS:   CC: DM/HTN follow-up  HPI: Pleasant 65 year old female presenting today to follow-up on:  Diabetes-checking her sugars a couple of times weekly with average readings in the 150s to 170s.  Taking metformin 500 mg twice daily.  Has not been able to tolerate higher doses of metformin due to GI issues.  Also taking Glucotrol 10 mg twice daily and Farxiga 10 mg daily.  Notes that she has not been quite as studious with her diabetic diet in the past couple of months.  Hypertension-currently taking amlodipine 10 mg daily, carvedilol 25 mg twice daily, losartan 50 mg daily, and hydrochlorothiazide 25mg  daily as needed.  Checking blood pressures at home with average systolic readings 100-110s.  Notes that her heart rate does tend to drop a little lower although her average heart rate is in the upper 50s to 60s.  When she gets tired or has worked long hours, she noted does drop in the 40s but she is asymptomatic. Denies CP, SOB, palpitations, lower extremity edema, dizziness, headaches, or vision changes.  Hyperlipidemia- not currently being treated for cholesterol concerns.  A statin was recommended at her blood draw in September however we did not receive first response with if she was willing to start medication.  Has had several weeks of vaginal itching and burning.  She also has experienced thick white cottage cheese type discharge.  Notes that her irritation is more on the outside rather than the inside at this point.  She has used multiple over-the-counter treatments without full resolution of her symptoms.  I reviewed the past medical history, family history, social history, surgical history, and allergies today and no changes were needed.  Please see the problem list section below in epic for further details.   Physical exam:   General: Well Developed, well nourished, and in no acute distress.  Neuro: Alert and oriented x3. HEENT: Normocephalic, atraumatic.  Skin:  Warm and dry. Cardiac: Regular rate and rhythm, no murmurs rubs or gallops, no lower extremity edema.  Respiratory: Clear to auscultation bilaterally. Not using accessory muscles, speaking in full sentences.  Impression and Recommendations:    1. Type 2 diabetes mellitus with hyperglycemia, without long-term current use of insulin (HCC) POCT hemoglobin A1c 7.4% today improved from 7.8% 3 months ago.  Continue Glucotrol 10 mg twice daily, Farxiga 10 mg daily, metformin 500 mg twice daily.  Starting Mounjaro 2.5 mg weekly.  Continue monitoring glucose and be watchful for hypoglycemic episodes. - POCT HgB A1C  2. Essential hypertension Blood pressure looks great today and home readings are at goal.  Continue amlodipine, losartan, carvedilol, and as needed HCTZ.  Monitor heart rate and if it continues to run lower than 50, we may need to back off on the Coreg a bit.  3. Hypertriglyceridemia Would recommend therapy with a statin as well as addition of daily fish oil.  Discovered this admission after the appointment was complete so patient called and voicemail left.  4. Vulvovaginal candidiasis Sending in Diflucan 1 tab today followed by another dose in 72 hours.  Also sending in clotrimazole to use on external areas twice daily as needed.  5. Need for shingles vaccine Shingrix No. 2 given in office today. - Varicella-zoster vaccine IM  Return in about 3 months (around 06/03/2021) for DM/HTN/HLD follow up. ___________________________________________ 06/05/2021, DNP, APRN, FNP-BC Primary Care and Sports Medicine [facility]

## 2021-03-07 ENCOUNTER — Other Ambulatory Visit (HOSPITAL_BASED_OUTPATIENT_CLINIC_OR_DEPARTMENT_OTHER): Payer: Self-pay

## 2021-04-11 ENCOUNTER — Other Ambulatory Visit (HOSPITAL_BASED_OUTPATIENT_CLINIC_OR_DEPARTMENT_OTHER): Payer: Self-pay

## 2021-04-11 ENCOUNTER — Other Ambulatory Visit: Payer: Self-pay

## 2021-04-11 ENCOUNTER — Other Ambulatory Visit: Payer: Self-pay | Admitting: Medical-Surgical

## 2021-04-11 MED ORDER — MOUNJARO 2.5 MG/0.5ML ~~LOC~~ SOAJ
2.5000 mg | SUBCUTANEOUS | 0 refills | Status: DC
  Filled 2021-04-11 – 2021-04-21 (×3): qty 2, 28d supply, fill #0

## 2021-04-12 ENCOUNTER — Other Ambulatory Visit: Payer: Self-pay

## 2021-04-12 ENCOUNTER — Encounter: Payer: Self-pay | Admitting: Medical-Surgical

## 2021-04-14 ENCOUNTER — Other Ambulatory Visit: Payer: Self-pay

## 2021-04-18 ENCOUNTER — Other Ambulatory Visit: Payer: Self-pay

## 2021-04-21 ENCOUNTER — Other Ambulatory Visit (HOSPITAL_BASED_OUTPATIENT_CLINIC_OR_DEPARTMENT_OTHER): Payer: Self-pay

## 2021-04-21 ENCOUNTER — Other Ambulatory Visit: Payer: Self-pay

## 2021-04-23 ENCOUNTER — Other Ambulatory Visit: Payer: Self-pay

## 2021-05-01 ENCOUNTER — Other Ambulatory Visit: Payer: Self-pay

## 2021-05-01 ENCOUNTER — Ambulatory Visit (INDEPENDENT_AMBULATORY_CARE_PROVIDER_SITE_OTHER): Payer: 59 | Admitting: Internal Medicine

## 2021-05-01 ENCOUNTER — Encounter: Payer: Self-pay | Admitting: Internal Medicine

## 2021-05-01 VITALS — BP 120/62 | HR 59 | Ht 65.0 in | Wt 242.0 lb

## 2021-05-01 DIAGNOSIS — K219 Gastro-esophageal reflux disease without esophagitis: Secondary | ICD-10-CM

## 2021-05-01 DIAGNOSIS — R198 Other specified symptoms and signs involving the digestive system and abdomen: Secondary | ICD-10-CM

## 2021-05-01 DIAGNOSIS — R1310 Dysphagia, unspecified: Secondary | ICD-10-CM | POA: Diagnosis not present

## 2021-05-01 DIAGNOSIS — Z1211 Encounter for screening for malignant neoplasm of colon: Secondary | ICD-10-CM | POA: Diagnosis not present

## 2021-05-01 MED ORDER — ESOMEPRAZOLE MAGNESIUM 40 MG PO CPDR
40.0000 mg | DELAYED_RELEASE_CAPSULE | Freq: Every day | ORAL | 3 refills | Status: DC
  Filled 2021-05-01: qty 30, 30d supply, fill #0
  Filled 2021-05-29: qty 90, 90d supply, fill #1

## 2021-05-01 NOTE — Progress Notes (Signed)
Chief Complaint: Diarrhea, colon cancer screening  HPI : 66 year old female with history of DM and HTN presents with diarrhea and colon cancer screening  She has had diarrhea issues since she started her metformin medication. She was initially placed on metformin 1000 mg BID but was not able to tolerate this dose so her metformin dose was reduced to 500 mg BID. Denies blood in stools. If she does not eat well, then she will have some loose stools. After she had her gallbladder removed, she did have some worsened diarrhea, and she took cholestyramine for a couple of months. She feels like the cholestyramine helped get her bowel habits back to her previous normal. Sometimes she will have watery stools and other times she will be constipated. If she is eating normally, she will have 1 BM every other day. If she is eating too many sweets, then she will be running to the bathroom several times a day. Ever now and then she will feel like food will get stuck in her chest, and she will have a hard time getting food down. She will have to be careful with certain foods. Dysphagia started about a year ago and occurs once per month. Denies ab pain. Endorses chest burning and regurgitation. She takes OTCs for GERD and takes it BID PRN (20 mg in the morning and 40 mg in the evening). She has never had a colonoscopy in the past. She had EGDs done in the past many years ago that was done at [place]. She was enrolled in a clinical trial at that time for GERD.  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   Past Medical History:  Diagnosis Date   COVID-19 04/2020   also had in 2020   Diabetes mellitus without complication (HCC)    Hypertension      Past Surgical History:  Procedure Laterality Date   ABDOMINAL HYSTERECTOMY     2007   CATARACT EXTRACTION     CHOLECYSTECTOMY     TUBAL LIGATION     Family History  Problem Relation Age of Onset   Hypertension Mother    Heart failure Mother     Diabetes Mother    Skin cancer Mother    Hypertension Father    Heart failure Father    Diabetes Father    Prostate cancer Father    Diabetes Sister    Heart failure Brother    Hypertension Maternal Grandfather    Heart attack Maternal Grandfather    Stroke Maternal Grandfather    Colon cancer Neg Hx    Stomach cancer Neg Hx    Esophageal cancer Neg Hx    Pancreatic cancer Neg Hx    Social History   Tobacco Use   Smoking status: Never   Smokeless tobacco: Never  Substance Use Topics   Alcohol use: Yes   Drug use: Never   Current Outpatient Medications  Medication Sig Dispense Refill   amLODipine (NORVASC) 10 MG tablet Take 1 tablet (10 mg total) by mouth daily. 90 tablet 1   CALCIUM PO Take 1 tablet by mouth daily.     carvedilol (COREG) 25 MG tablet Take 1 tablet (25 mg total) by mouth 2 (two) times daily with a meal. 180 tablet 1   clotrimazole (CLOTRIMAZOLE ANTI-FUNGAL) 1 % cream Apply 1 application topically 2 (two) times daily. 30 g 0   dapagliflozin propanediol (FARXIGA) 10 MG TABS tablet Take 1 tablet (10 mg total) by mouth  daily. 90 tablet 1   esomeprazole (NEXIUM) 40 MG capsule Take 1 capsule (40 mg total) by mouth daily. 30 minutes before a meal 30 capsule 3   FLUoxetine (PROZAC) 40 MG capsule Take 1 capsule (40 mg total) by mouth daily. 90 capsule 1   glipiZIDE (GLUCOTROL) 10 MG tablet Take 1 tablet (10 mg total) by mouth 2 (two) times daily. 180 tablet 1   hydrochlorothiazide (HYDRODIURIL) 25 MG tablet Take 1 tablet (25 mg total) by mouth daily as needed. 90 tablet 1   losartan (COZAAR) 50 MG tablet Take 1 tablet (50 mg total) by mouth daily. 90 tablet 1   metFORMIN (GLUCOPHAGE) 500 MG tablet Take 500 mg by mouth 2 (two) times daily with a meal.     tirzepatide (MOUNJARO) 2.5 MG/0.5ML Pen Inject 2.5 mg into the skin once a week. 2 mL 0   No current facility-administered medications for this visit.   No Known Allergies   Review of Systems: All systems reviewed  and negative except where noted in HPI.   Physical Exam: BP 120/62    Pulse (!) 59    Ht 5\' 5"  (1.651 m)    Wt 242 lb (109.8 kg)    SpO2 98%    BMI 40.27 kg/m  Constitutional: Pleasant,well-developed, female in no acute distress. HEENT: Normocephalic and atraumatic. Conjunctivae are normal. No scleral icterus. Cardiovascular: Normal rate, regular rhythm.  Pulmonary/chest: Effort normal and breath sounds normal. No wheezing, rales or rhonchi. Abdominal: Soft, nondistended, nontender. Bowel sounds active throughout. There are no masses palpable. No hepatomegaly. Extremities: No edema Neurological: Alert and oriented to person place and time. Skin: Skin is warm and dry. No rashes noted. Psychiatric: Normal mood and affect. Behavior is normal.  Labs 11/2020: CBC nml, CMP unremarkable. TSH nml. HCV Ab neg. HIV NR. HbA1C 7.4%  CT A/P w/contrast 10/13/12: IMPRESSION:  Cholelithiasis.  Descending colonic and sigmoid diverticulosis.  No acute findings.   ASSESSMENT AND PLAN: Colon cancer screening Alternating constipation and diarrhea GERD Dysphagia Patient presents for consideration of colonoscopy for colon cancer screening, and noted some alternating constipation and diarrhea, which may be related to intake of certain foods and metformin use.  Will have the patient try a low FODMAP diet and daily fiber supplement to see if this helps with some of her bowel habits.  Patient also has underlying issues with GERD so we will have the patient's start Nexium 40 mg daily to see if this helps control her symptoms better.  She noted some dysphagia during our discussion today so we will plan for EGD for further evaluation to rule out peptic stricture or malignancy.  I went over the risks and benefits of EGD and colonoscopy with the patient.  Patient is agreeable to proceed. - Low FODMAP diet - Start daily fiber supplement - Start Nexium 40 mg QD, take 30 min to 1 hours - EGD/colonoscopy [facility]  Christia Reading, MD

## 2021-05-01 NOTE — Patient Instructions (Addendum)
You have been scheduled for an endoscopy and colonoscopy. Please follow the written instructions given to you at your visit today. Please pick up your prep supplies at the pharmacy within the next 1-3 days. If you use inhalers (even only as needed), please bring them with you on the day of your procedure.   LOW FODMAP Handout given  Take a daily fiber supplement  We have sent Nexium to your pharmacy  If you are age 64 or older, your body mass index should be between 23-30. Your Body mass index is 40.27 kg/m. If this is out of the aforementioned range listed, please consider follow up with your Primary Care Provider.  If you are age 79 or younger, your body mass index should be between 19-25. Your Body mass index is 40.27 kg/m. If this is out of the aformentioned range listed, please consider follow up with your Primary Care Provider.   ________________________________________________________  The [facility] providers would like to encourage you to use [facility] to communicate with providers for non-urgent requests or questions.  Due to long hold times on the telephone, sending your provider a message by [facility] may be a faster and more efficient way to get a response.  Please allow 48 business hours for a response.  Please remember that this is for non-urgent requests.  _______________________________________________________   Due to recent changes in healthcare laws, you may see the results of your imaging and laboratory studies on MyChart before your provider has had a chance to review them.  We understand that in some cases there may be results that are confusing or concerning to you. Not all laboratory results come back in the same time frame and the provider may be waiting for multiple results in order to interpret others.  Please give [place] 48 hours in order for your provider to thoroughly review all the results before contacting the office for clarification of your results.    I appreciate  the  opportunity to care for you  Thank You   Dayna Barker, MD

## 2021-05-05 ENCOUNTER — Other Ambulatory Visit: Payer: Self-pay

## 2021-05-06 ENCOUNTER — Other Ambulatory Visit: Payer: Self-pay

## 2021-05-07 ENCOUNTER — Other Ambulatory Visit: Payer: Self-pay

## 2021-05-09 ENCOUNTER — Other Ambulatory Visit: Payer: Self-pay

## 2021-05-22 ENCOUNTER — Other Ambulatory Visit: Payer: Self-pay | Admitting: Medical-Surgical

## 2021-05-22 ENCOUNTER — Other Ambulatory Visit: Payer: Self-pay

## 2021-05-23 ENCOUNTER — Other Ambulatory Visit: Payer: Self-pay

## 2021-05-23 MED ORDER — MOUNJARO 2.5 MG/0.5ML ~~LOC~~ SOAJ
2.5000 mg | SUBCUTANEOUS | 0 refills | Status: DC
  Filled 2021-05-23: qty 2, 28d supply, fill #0

## 2021-05-26 ENCOUNTER — Other Ambulatory Visit: Payer: Self-pay

## 2021-05-29 ENCOUNTER — Other Ambulatory Visit: Payer: Self-pay

## 2021-05-29 DIAGNOSIS — M546 Pain in thoracic spine: Secondary | ICD-10-CM | POA: Diagnosis not present

## 2021-05-29 DIAGNOSIS — M545 Low back pain, unspecified: Secondary | ICD-10-CM | POA: Diagnosis not present

## 2021-05-29 DIAGNOSIS — M9903 Segmental and somatic dysfunction of lumbar region: Secondary | ICD-10-CM | POA: Diagnosis not present

## 2021-05-29 DIAGNOSIS — M9902 Segmental and somatic dysfunction of thoracic region: Secondary | ICD-10-CM | POA: Diagnosis not present

## 2021-05-29 DIAGNOSIS — M62838 Other muscle spasm: Secondary | ICD-10-CM | POA: Diagnosis not present

## 2021-05-29 DIAGNOSIS — M62830 Muscle spasm of back: Secondary | ICD-10-CM | POA: Diagnosis not present

## 2021-05-29 DIAGNOSIS — M9901 Segmental and somatic dysfunction of cervical region: Secondary | ICD-10-CM | POA: Diagnosis not present

## 2021-05-29 DIAGNOSIS — M542 Cervicalgia: Secondary | ICD-10-CM | POA: Diagnosis not present

## 2021-06-03 ENCOUNTER — Ambulatory Visit: Payer: 59 | Admitting: Medical-Surgical

## 2021-06-05 ENCOUNTER — Encounter: Payer: Self-pay | Admitting: Internal Medicine

## 2021-06-11 ENCOUNTER — Ambulatory Visit (AMBULATORY_SURGERY_CENTER): Payer: 59 | Admitting: Internal Medicine

## 2021-06-11 ENCOUNTER — Encounter: Payer: Self-pay | Admitting: Internal Medicine

## 2021-06-11 ENCOUNTER — Other Ambulatory Visit: Payer: Self-pay

## 2021-06-11 VITALS — BP 144/69 | HR 50 | Temp 97.8°F | Resp 14 | Ht 65.0 in | Wt 242.0 lb

## 2021-06-11 DIAGNOSIS — R1310 Dysphagia, unspecified: Secondary | ICD-10-CM | POA: Diagnosis not present

## 2021-06-11 DIAGNOSIS — K2970 Gastritis, unspecified, without bleeding: Secondary | ICD-10-CM | POA: Diagnosis not present

## 2021-06-11 DIAGNOSIS — K2950 Unspecified chronic gastritis without bleeding: Secondary | ICD-10-CM | POA: Diagnosis not present

## 2021-06-11 DIAGNOSIS — K219 Gastro-esophageal reflux disease without esophagitis: Secondary | ICD-10-CM

## 2021-06-11 DIAGNOSIS — Z1211 Encounter for screening for malignant neoplasm of colon: Secondary | ICD-10-CM | POA: Diagnosis not present

## 2021-06-11 DIAGNOSIS — K2289 Other specified disease of esophagus: Secondary | ICD-10-CM | POA: Diagnosis not present

## 2021-06-11 NOTE — Patient Instructions (Signed)
Please read handouts provided. Continue present medications. Await pathology results. Repeat colonoscopy in 10 years for screening.     YOU HAD AN ENDOSCOPIC PROCEDURE TODAY AT THE [facility]:   Refer to the procedure report that was given to you for any specific questions about what was found during the examination.  If the procedure report does not answer your questions, please call your gastroenterologist to clarify.  If you requested that your care partner not be given the details of your procedure findings, then the procedure report has been included in a sealed envelope for you to review at your convenience later.  YOU SHOULD EXPECT: Some feelings of bloating in the abdomen. Passage of more gas than usual.  Walking can help get rid of the air that was put into your GI tract during the procedure and reduce the bloating. If you had a lower endoscopy (such as a colonoscopy or flexible sigmoidoscopy) you may notice spotting of blood in your stool or on the toilet paper. If you underwent a bowel prep for your procedure, you may not have a normal bowel movement for a few days.  Please Note:  You might notice some irritation and congestion in your nose or some drainage.  This is from the oxygen used during your procedure.  There is no need for concern and it should clear up in a day or so.  SYMPTOMS TO REPORT IMMEDIATELY:   Following lower endoscopy (colonoscopy or flexible sigmoidoscopy):  Excessive amounts of blood in the stool  Significant tenderness or worsening of abdominal pains  Swelling of the abdomen that is new, acute  Fever of 100F or higher   Following upper endoscopy (EGD)  Vomiting of blood or coffee ground material  New chest pain or pain under the shoulder blades  Painful or persistently difficult swallowing  New shortness of breath  Fever of 100F or higher  Black, tarry-looking stools  For urgent or emergent issues, a gastroenterologist can be reached  at any hour by calling [phone]. Do not use MyChart messaging for urgent concerns.    DIET:  We do recommend a small meal at first, but then you may proceed to your regular diet.  Drink plenty of fluids but you should avoid alcoholic beverages for 24 hours.  ACTIVITY:  You should plan to take it easy for the rest of today and you should NOT DRIVE or use heavy machinery until tomorrow (because of the sedation medicines used during the test).    FOLLOW UP: Our staff will call the number listed on your records 48-72 hours following your procedure to check on you and address any questions or concerns that you may have regarding the information given to you following your procedure. If we do not reach you, we will leave a message.  We will attempt to reach you two times.  During this call, we will ask if you have developed any symptoms of COVID 19. If you develop any symptoms (ie: fever, flu-like symptoms, shortness of breath, cough etc.) before then, please call [phone].  If you test positive for Covid 19 in the 2 weeks post procedure, please call and report this information to us.    If any biopsies were taken you will be contacted by phone or by letter within the next 1-3 weeks.  Please call us at [phone] if you have not heard about the biopsies in 3 weeks.    SIGNATURES/CONFIDENTIALITY: You and/or your care partner have signed paperwork which   into your electronic medical record.  These signatures attest to the fact that that the information above on your After Visit Summary has been reviewed and is understood.  Full responsibility of the confidentiality of this discharge information lies with you and/or your care-partner.  ?

## 2021-06-11 NOTE — Op Note (Signed)
[facility] ?Patient Name: Alicia Pineda ?Procedure Date: 06/11/2021 2:51 PM ?MRN: [id number] ?Endoscopist: Nicole Kindred "Eulah Pont ,  ?Age: 66 ?Referring MD:  ?Date of Birth: 1955/09/12 ?Gender: Female ?Account #: [id number] ?Procedure:                Colonoscopy ?Indications:              Screening for colorectal malignant neoplasm ?Medicines:                Monitored Anesthesia Care ?Procedure:                After obtaining informed consent, the colonoscope  ?                          was passed under direct vision. Throughout the  ?                          procedure, the patient's blood pressure, pulse, and  ?                          oxygen saturations were monitored continuously. The  ?                          CF [id number] #[id number] was introduced through the anus  ?                          and advanced to the the terminal ileum. The  ?                          colonoscopy was performed without difficulty. The  ?                          patient tolerated the procedure well. The quality  ?                          of the bowel preparation was good. The terminal  ?                          ileum, ileocecal valve, appendiceal orifice, and  ?                          rectum were photographed. ?Scope In: 3:05:50 PM ?Scope Out: 3:21:03 PM ?Scope Withdrawal Time: 0 hours 12 minutes 11 seconds  ?Total Procedure Duration: 0 hours 15 minutes 13 seconds  ?Findings:                 The terminal ileum appeared normal. ?                          Multiple diverticula were found in the sigmoid  ?                          colon. ?                          Non-bleeding internal hemorrhoids were found during  ?  retroflexion. ?Complications:            No immediate complications. ?Estimated Blood Loss:     Estimated blood loss was minimal. ?Impression:               - The examined portion of the ileum was normal. ?                          - Diverticulosis in the sigmoid colon. ?                           - Non-bleeding internal hemorrhoids. ?                          - No specimens collected. ?Recommendation:           - Discharge patient to home (with escort). ?                          - Repeat colonoscopy in 10 years for screening  ?                          purposes. ?                          - The findings and recommendations were discussed  ?                          with the patient. ?[name],  ?06/11/2021 3:27:51 PM ?

## 2021-06-11 NOTE — Progress Notes (Signed)
Pt's states no medical or surgical changes since previsit or office visit. 

## 2021-06-11 NOTE — Progress Notes (Signed)
A and O x3. Report to RN. Tolerated MAC anesthesia well. Teeth unchanged after procedure.  ?

## 2021-06-11 NOTE — Op Note (Signed)
[facility] ?Patient Name: Alicia Pineda ?Procedure Date: 06/11/2021 2:51 PM ?MRN: [id number] ?Endoscopist: Nicole Kindred "Eulah Pont ,  ?Age: 66 ?Referring MD:  ?Date of Birth: [birth year] ?Gender: Female ?Account #: [id number] ?Procedure:                Upper GI endoscopy ?Indications:              Dysphagia ?Medicines:                Monitored Anesthesia Care ?Procedure:                Pre-Anesthesia Assessment: ?                          - Prior to the procedure, a History and Physical  ?                          was performed, and patient medications and  ?                          allergies were reviewed. The patient's tolerance of  ?                          previous anesthesia was also reviewed. The risks  ?                          and benefits of the procedure and the sedation  ?                          options and risks were discussed with the patient.  ?                          All questions were answered, and informed consent  ?                          was obtained. Prior Anticoagulants: The patient has  ?                          taken no previous anticoagulant or antiplatelet  ?                          agents. ASA Grade Assessment: III - A patient with  ?                          severe systemic disease. After reviewing the risks  ?                          and benefits, the patient was deemed in  ?                          satisfactory condition to undergo the procedure. ?                          After obtaining informed consent, the endoscope was  ?  passed under direct vision. Throughout the  ?                          procedure, the patient's blood pressure, pulse, and  ?                          oxygen saturations were monitored continuously. The  ?                          GIF [id number] #[id number] was introduced through the  ?                          mouth, and advanced to the second part of duodenum.  ?                          The upper GI endoscopy was accomplished  without  ?                          difficulty. The patient tolerated the procedure  ?                          well. ?Scope In: ?Scope Out: ?Findings:                 The examined esophagus was normal. Biopsies were  ?                          taken with a cold forceps for histology. ?                          Diffuse inflammation characterized by congestion  ?                          (edema), erythema and granularity was found in the  ?                          gastric body and in the gastric antrum. Biopsies  ?                          were taken with a cold forceps for histology. ?                          The examined duodenum was normal. ?Complications:            No immediate complications. ?Estimated Blood Loss:     Estimated blood loss was minimal. ?Impression:               - Normal esophagus. Biopsied. ?                          - Gastritis. Biopsied. ?                          - Normal examined duodenum. ?Recommendation:           - Await pathology results. ?                          -  Perform a colonoscopy today. ?[name],  ?06/11/2021 3:24:19 PM ?

## 2021-06-11 NOTE — Progress Notes (Signed)
? ?GASTROENTEROLOGY PROCEDURE H&P NOTE  ? ?Primary Care Physician: ?Christen Butter, NP ? ? ? ?Reason for Procedure:   Dysphagia, colon cancer screening ? ?Plan:    EGD/colonoscopy ? ?Patient is appropriate for endoscopic procedure(s) in the ambulatory ([facility]) setting. ? ?The nature of the procedure, as well as the risks, benefits, and alternatives were carefully and thoroughly reviewed with the patient. Ample time for discussion and questions allowed. The patient understood, was satisfied, and agreed to proceed.  ? ? ? ?HPI: ?Alicia Pineda is a 66 y.o. female who presents for EGD/colonoscopy for evaluation of dysphagia, colon cancer screening .  Patient was most recently seen in the Gastroenterology Clinic on 05/01/21.  No interval change in medical history since that appointment. Please refer to that note for full details regarding GI history and clinical presentation.  ? ?Past Medical History:  ?Diagnosis Date  ? COVID-19 04/2020  ? also had in 2020  ? Diabetes mellitus without complication (HCC)   ? Hypertension   ? ? ?Past Surgical History:  ?Procedure Laterality Date  ? ABDOMINAL HYSTERECTOMY    ? 2007  ? CATARACT EXTRACTION    ? CHOLECYSTECTOMY    ? TUBAL LIGATION    ? ? ?Prior to Admission medications   ?Medication Sig Start Date End Date Taking? Authorizing Provider  ?amLODipine (NORVASC) 10 MG tablet Take 1 tablet (10 mg total) by mouth daily. 03/05/21  Yes Christen Butter, NP  ?CALCIUM PO Take 1 tablet by mouth daily.   Yes [provider]  ?carvedilol (COREG) 25 MG tablet Take 1 tablet (25 mg total) by mouth 2 (two) times daily with a meal. 03/05/21  Yes Christen Butter, NP  ?dapagliflozin propanediol (FARXIGA) 10 MG TABS tablet Take 1 tablet (10 mg total) by mouth daily. 03/05/21  Yes Christen Butter, NP  ?esomeprazole (NEXIUM) 40 MG capsule Take 1 capsule (40 mg total) by mouth daily. 30 minutes before a meal 05/01/21  Yes Imogene Burn, MD  ?FLUoxetine (PROZAC) 40 MG capsule Take 1 capsule (40 mg total) by  mouth daily. 03/05/21  Yes Christen Butter, NP  ?glipiZIDE (GLUCOTROL) 10 MG tablet Take 1 tablet (10 mg total) by mouth 2 (two) times daily. 03/05/21  Yes Christen Butter, NP  ?ibuprofen (ADVIL) 200 MG tablet Take by mouth.   Yes [provider]  ?losartan (COZAAR) 50 MG tablet Take 1 tablet (50 mg total) by mouth daily. 03/05/21  Yes Christen Butter, NP  ?metFORMIN (GLUCOPHAGE) 500 MG tablet Take 500 mg by mouth 2 (two) times daily with a meal.   Yes [provider]  ?tirzepatide Greggory Keen) 2.5 MG/0.5ML Pen Inject 2.5 mg into the skin once a week. 05/23/21  Yes Christen Butter, NP  ?clotrimazole (CLOTRIMAZOLE ANTI-FUNGAL) 1 % cream Apply 1 application topically 2 (two) times daily. 03/05/21   Christen Butter, NP  ?hydrochlorothiazide (HYDRODIURIL) 25 MG tablet Take 1 tablet (25 mg total) by mouth daily as needed. 03/05/21   Christen Butter, NP  ? ? ?Current Outpatient Medications  ?Medication Sig Dispense Refill  ? amLODipine (NORVASC) 10 MG tablet Take 1 tablet (10 mg total) by mouth daily. 90 tablet 1  ? CALCIUM PO Take 1 tablet by mouth daily.    ? carvedilol (COREG) 25 MG tablet Take 1 tablet (25 mg total) by mouth 2 (two) times daily with a meal. 180 tablet 1  ? dapagliflozin propanediol (FARXIGA) 10 MG TABS tablet Take 1 tablet (10 mg total) by mouth daily. 90 tablet 1  ? esomeprazole (  NEXIUM) 40 MG capsule Take 1 capsule (40 mg total) by mouth daily. 30 minutes before a meal 30 capsule 3  ? FLUoxetine (PROZAC) 40 MG capsule Take 1 capsule (40 mg total) by mouth daily. 90 capsule 1  ? glipiZIDE (GLUCOTROL) 10 MG tablet Take 1 tablet (10 mg total) by mouth 2 (two) times daily. 180 tablet 1  ? ibuprofen (ADVIL) 200 MG tablet Take by mouth.    ? losartan (COZAAR) 50 MG tablet Take 1 tablet (50 mg total) by mouth daily. 90 tablet 1  ? metFORMIN (GLUCOPHAGE) 500 MG tablet Take 500 mg by mouth 2 (two) times daily with a meal.    ? tirzepatide (MOUNJARO) 2.5 MG/0.5ML Pen Inject 2.5 mg into the skin once a week. 2 mL 0  ?  clotrimazole (CLOTRIMAZOLE ANTI-FUNGAL) 1 % cream Apply 1 application topically 2 (two) times daily. 30 g 0  ? hydrochlorothiazide (HYDRODIURIL) 25 MG tablet Take 1 tablet (25 mg total) by mouth daily as needed. 90 tablet 1  ? ?No current facility-administered medications for this visit.  ? ? ?Allergies as of 06/11/2021  ? (No Known Allergies)  ? ? ?Family History  ?Problem Relation Age of Onset  ? Hypertension Mother   ? Heart failure Mother   ? Diabetes Mother   ? Skin cancer Mother   ? Hypertension Father   ? Heart failure Father   ? Diabetes Father   ? Prostate cancer Father   ? Diabetes Sister   ? Heart failure Brother   ? Hypertension Maternal Grandfather   ? Heart attack Maternal Grandfather   ? Stroke Maternal Grandfather   ? Colon cancer Neg Hx   ? Stomach cancer Neg Hx   ? Esophageal cancer Neg Hx   ? Pancreatic cancer Neg Hx   ? ? ?Social History  ? ?Socioeconomic History  ? Marital status: Divorced  ?  Spouse name: Not on file  ? Number of children: Not on file  ? Years of education: Not on file  ? Highest education level: Not on file  ?Occupational History  ? Not on file  ?Tobacco Use  ? Smoking status: Never  ? Smokeless tobacco: Never  ?Vaping Use  ? Vaping Use: Not on file  ?Substance and Sexual Activity  ? Alcohol use: Yes  ? Drug use: Never  ? Sexual activity: Not Currently  ?Other Topics Concern  ? Not on file  ?Social History Narrative  ? Not on file  ? ?Social Determinants of Health  ? ?Financial Resource Strain: Not on file  ?Food Insecurity: Not on file  ?Transportation Needs: Not on file  ?Physical Activity: Not on file  ?Stress: Not on file  ?Social Connections: Not on file  ?Intimate Partner Violence: Not on file  ? ? ?Physical Exam: ?Vital signs in last 24 hours: ?BP (!) 152/67   Pulse (!) 55   Temp 97.8 ?F (36.6 ?C)   Ht 5\' 5"  (1.651 m)   Wt 242 lb (109.8 kg)   SpO2 97%   BMI 40.27 kg/m?  ?GEN: NAD ?EYE: Sclerae anicteric ?ENT: MMM ?CV: Non-tachycardic ?Pulm: No increased WOB ?GI:  Soft ?NEURO:  Alert & Oriented ? ? ? , MD ?[facility] ? ? ?06/11/2021 1:59 PM ? ?

## 2021-06-11 NOTE — Progress Notes (Signed)
C.W. vital signs. 

## 2021-06-11 NOTE — Progress Notes (Signed)
Called to room to assist during endoscopic procedure.  Patient ID and intended procedure confirmed with present staff. Received instructions for my participation in the procedure from the performing physician.  

## 2021-06-13 ENCOUNTER — Telehealth: Payer: Self-pay

## 2021-06-13 NOTE — Telephone Encounter (Signed)
Left message on answering machine. 

## 2021-06-15 ENCOUNTER — Encounter: Payer: Self-pay | Admitting: Internal Medicine

## 2021-06-16 ENCOUNTER — Other Ambulatory Visit: Payer: Self-pay

## 2021-06-16 ENCOUNTER — Encounter: Payer: Self-pay | Admitting: Medical-Surgical

## 2021-06-16 ENCOUNTER — Ambulatory Visit: Payer: 59 | Admitting: Medical-Surgical

## 2021-06-16 VITALS — BP 135/63 | HR 46 | Resp 20 | Ht 65.0 in | Wt 230.0 lb

## 2021-06-16 DIAGNOSIS — E781 Pure hyperglyceridemia: Secondary | ICD-10-CM | POA: Diagnosis not present

## 2021-06-16 DIAGNOSIS — I10 Essential (primary) hypertension: Secondary | ICD-10-CM

## 2021-06-16 DIAGNOSIS — E1165 Type 2 diabetes mellitus with hyperglycemia: Secondary | ICD-10-CM | POA: Diagnosis not present

## 2021-06-16 LAB — POCT GLYCOSYLATED HEMOGLOBIN (HGB A1C): HbA1c, POC (controlled diabetic range): 6.1 % (ref 0.0–7.0)

## 2021-06-16 MED ORDER — TIRZEPATIDE 5 MG/0.5ML ~~LOC~~ SOAJ
5.0000 mg | SUBCUTANEOUS | 0 refills | Status: DC
  Filled 2021-06-16: qty 6, 84d supply, fill #0

## 2021-06-16 NOTE — Progress Notes (Signed)
?  HPI with pertinent ROS:  ? ?CC: Diabetes follow-up ? ?HPI: ?Pleasant 66 year old female presenting today for follow-up on: ? ?Diabetes-checking sugars at home with readings between 70 and 120s fasting and 147 maximum postprandial.  She has had 1 hypoglycemic episode where she felt weak and shaky.  She was able to check her sugar after she got in her car and notes that it was right at 70.  She had been at work and has not been able to eat like she should have.  Taking metformin 500 mg once daily due to GI effects.  Taking glipizide 10 mg twice daily and Farxiga 10 mg as prescribed.  Has been using Mounjaro 2.5 mg weekly, tolerating well without side effects. ? ?Hypertension-taking amlodipine 10 mg and losartan 50 mg daily.  Taking Coreg 25 mg twice daily.  Also taking hydrochlorothiazide 25 mg daily as needed however she has not had to use this very often.  Monitors blood pressure at home normally good readings.  Does note that she has a low heart rate in the 40s at times and is asymptomatic. Denies CP, SOB, palpitations, lower extremity edema, dizziness, headaches, or vision changes. ? ?I reviewed the past medical history, family history, social history, surgical history, and allergies today and no changes were needed.  Please see the problem list section below in epic for further details. ? ? ?Physical exam:  ? ?General: Well Developed, well nourished, and in no acute distress.  ?Neuro: Alert and oriented x3.  ?HEENT: Normocephalic, atraumatic.  ?Skin: Warm and dry. ?Cardiac: Regular rate and rhythm, no murmurs rubs or gallops, no lower extremity edema.  ?Respiratory: Clear to auscultation bilaterally. Not using accessory muscles, speaking in full sentences. ? ?Impression and Recommendations:   ? ?1. Type 2 diabetes mellitus with hyperglycemia, without long-term current use of insulin (HCC) ?POCT hemoglobin A1c 6.1% down from 7.4%.  12 pound weight loss since the end of January.  She has done exceedingly well on  the new medication.  Discussed discontinuing metformin due to GI side effects.  Increasing Mounjaro to 5 mg weekly.  For now, continue glipizide 10 mg twice daily and Farxiga 10 mg daily.  As Greggory Keen is increased, may be able to dial back on these oral medications or at least reduce the number of medications it takes to manage her diabetes appropriately. ?- POCT HgB A1C ?- COMPLETE METABOLIC PANEL WITH GFR ? ?2. Essential hypertension ?Checking CBC, CMP.  Blood pressure on recheck 135/63, at goal.  Continue blood pressure medications as prescribed.  If her heart rate continues to drop in the 40s and she becomes symptomatic, may need to reduce carvedilol with an increase in losartan. ?- CBC with Differential/Platelet ?- COMPLETE METABOLIC PANEL WITH GFR ? ?3. Hypertriglyceridemia ?Checking lipid panel today. ?- Lipid panel ? ?Return in about 6 months (around 12/17/2021) for chronic disease follow up. ?___________________________________________ ?Thayer Ohm, DNP, APRN, FNP-BC ?Primary Care and Sports Medicine ?[facility] ?

## 2021-06-17 ENCOUNTER — Other Ambulatory Visit: Payer: Self-pay

## 2021-06-17 LAB — CBC WITH DIFFERENTIAL/PLATELET
Absolute Monocytes: 530 cells/uL (ref 200–950)
Basophils Absolute: 37 cells/uL (ref 0–200)
Basophils Relative: 0.4 %
Eosinophils Absolute: 363 cells/uL (ref 15–500)
Eosinophils Relative: 3.9 %
HCT: 34.1 % — ABNORMAL LOW (ref 35.0–45.0)
Hemoglobin: 10.8 g/dL — ABNORMAL LOW (ref 11.7–15.5)
Lymphs Abs: 2120 cells/uL (ref 850–3900)
MCH: 25.8 pg — ABNORMAL LOW (ref 27.0–33.0)
MCHC: 31.7 g/dL — ABNORMAL LOW (ref 32.0–36.0)
MCV: 81.6 fL (ref 80.0–100.0)
MPV: 11.7 fL (ref 7.5–12.5)
Monocytes Relative: 5.7 %
Neutro Abs: 6250 cells/uL (ref 1500–7800)
Neutrophils Relative %: 67.2 %
Platelets: 284 10*3/uL (ref 140–400)
RBC: 4.18 10*6/uL (ref 3.80–5.10)
RDW: 14.7 % (ref 11.0–15.0)
Total Lymphocyte: 22.8 %
WBC: 9.3 10*3/uL (ref 3.8–10.8)

## 2021-06-17 LAB — LIPID PANEL
Cholesterol: 114 mg/dL (ref <200)
HDL: 39 mg/dL — ABNORMAL LOW (ref >=50)
LDL Cholesterol (Calc): 51 mg/dL (calc)
Non-HDL Cholesterol (Calc): 75 mg/dL (calc) (ref <130)
Total CHOL/HDL Ratio: 2.9 (calc) (ref <5.0)
Triglycerides: 162 mg/dL — ABNORMAL HIGH (ref <150)

## 2021-06-17 LAB — COMPLETE METABOLIC PANEL WITH GFR
AG Ratio: 1.5 (calc) (ref 1.0–2.5)
ALT: 14 U/L (ref 6–29)
AST: 13 U/L (ref 10–35)
Albumin: 3.8 g/dL (ref 3.6–5.1)
Alkaline phosphatase (APISO): 125 U/L (ref 37–153)
BUN/Creatinine Ratio: 44 (calc) — ABNORMAL HIGH (ref 6–22)
BUN: 34 mg/dL — ABNORMAL HIGH (ref 7–25)
CO2: 25 mmol/L (ref 20–32)
Calcium: 9.2 mg/dL (ref 8.6–10.4)
Chloride: 107 mmol/L (ref 98–110)
Creat: 0.77 mg/dL (ref 0.50–1.05)
Globulin: 2.6 g/dL (calc) (ref 1.9–3.7)
Glucose, Bld: 82 mg/dL (ref 65–139)
Potassium: 4 mmol/L (ref 3.5–5.3)
Sodium: 140 mmol/L (ref 135–146)
Total Bilirubin: 0.2 mg/dL (ref 0.2–1.2)
Total Protein: 6.4 g/dL (ref 6.1–8.1)
eGFR: 87 mL/min/{1.73_m2} (ref >=60)

## 2021-06-23 DIAGNOSIS — M9902 Segmental and somatic dysfunction of thoracic region: Secondary | ICD-10-CM | POA: Diagnosis not present

## 2021-06-23 DIAGNOSIS — M62830 Muscle spasm of back: Secondary | ICD-10-CM | POA: Diagnosis not present

## 2021-06-23 DIAGNOSIS — M62838 Other muscle spasm: Secondary | ICD-10-CM | POA: Diagnosis not present

## 2021-06-23 DIAGNOSIS — M545 Low back pain, unspecified: Secondary | ICD-10-CM | POA: Diagnosis not present

## 2021-06-23 DIAGNOSIS — M9903 Segmental and somatic dysfunction of lumbar region: Secondary | ICD-10-CM | POA: Diagnosis not present

## 2021-06-23 DIAGNOSIS — M9901 Segmental and somatic dysfunction of cervical region: Secondary | ICD-10-CM | POA: Diagnosis not present

## 2021-06-23 DIAGNOSIS — M542 Cervicalgia: Secondary | ICD-10-CM | POA: Diagnosis not present

## 2021-06-23 DIAGNOSIS — M546 Pain in thoracic spine: Secondary | ICD-10-CM | POA: Diagnosis not present

## 2021-06-25 DIAGNOSIS — M62838 Other muscle spasm: Secondary | ICD-10-CM | POA: Diagnosis not present

## 2021-06-25 DIAGNOSIS — M542 Cervicalgia: Secondary | ICD-10-CM | POA: Diagnosis not present

## 2021-06-25 DIAGNOSIS — M62830 Muscle spasm of back: Secondary | ICD-10-CM | POA: Diagnosis not present

## 2021-06-25 DIAGNOSIS — M9901 Segmental and somatic dysfunction of cervical region: Secondary | ICD-10-CM | POA: Diagnosis not present

## 2021-06-25 DIAGNOSIS — M9902 Segmental and somatic dysfunction of thoracic region: Secondary | ICD-10-CM | POA: Diagnosis not present

## 2021-06-25 DIAGNOSIS — M546 Pain in thoracic spine: Secondary | ICD-10-CM | POA: Diagnosis not present

## 2021-06-25 DIAGNOSIS — M545 Low back pain, unspecified: Secondary | ICD-10-CM | POA: Diagnosis not present

## 2021-06-25 DIAGNOSIS — M9903 Segmental and somatic dysfunction of lumbar region: Secondary | ICD-10-CM | POA: Diagnosis not present

## 2021-06-26 DIAGNOSIS — M62830 Muscle spasm of back: Secondary | ICD-10-CM | POA: Diagnosis not present

## 2021-06-26 DIAGNOSIS — M9902 Segmental and somatic dysfunction of thoracic region: Secondary | ICD-10-CM | POA: Diagnosis not present

## 2021-06-26 DIAGNOSIS — M545 Low back pain, unspecified: Secondary | ICD-10-CM | POA: Diagnosis not present

## 2021-06-26 DIAGNOSIS — M542 Cervicalgia: Secondary | ICD-10-CM | POA: Diagnosis not present

## 2021-06-26 DIAGNOSIS — M62838 Other muscle spasm: Secondary | ICD-10-CM | POA: Diagnosis not present

## 2021-06-26 DIAGNOSIS — M9901 Segmental and somatic dysfunction of cervical region: Secondary | ICD-10-CM | POA: Diagnosis not present

## 2021-06-26 DIAGNOSIS — M9903 Segmental and somatic dysfunction of lumbar region: Secondary | ICD-10-CM | POA: Diagnosis not present

## 2021-06-26 DIAGNOSIS — M546 Pain in thoracic spine: Secondary | ICD-10-CM | POA: Diagnosis not present

## 2021-06-29 ENCOUNTER — Encounter: Payer: Self-pay | Admitting: Internal Medicine

## 2021-07-02 DIAGNOSIS — M9902 Segmental and somatic dysfunction of thoracic region: Secondary | ICD-10-CM | POA: Diagnosis not present

## 2021-07-02 DIAGNOSIS — M545 Low back pain, unspecified: Secondary | ICD-10-CM | POA: Diagnosis not present

## 2021-07-02 DIAGNOSIS — M542 Cervicalgia: Secondary | ICD-10-CM | POA: Diagnosis not present

## 2021-07-02 DIAGNOSIS — M9901 Segmental and somatic dysfunction of cervical region: Secondary | ICD-10-CM | POA: Diagnosis not present

## 2021-07-02 DIAGNOSIS — M9903 Segmental and somatic dysfunction of lumbar region: Secondary | ICD-10-CM | POA: Diagnosis not present

## 2021-07-02 DIAGNOSIS — M62830 Muscle spasm of back: Secondary | ICD-10-CM | POA: Diagnosis not present

## 2021-07-02 DIAGNOSIS — M546 Pain in thoracic spine: Secondary | ICD-10-CM | POA: Diagnosis not present

## 2021-07-02 DIAGNOSIS — M62838 Other muscle spasm: Secondary | ICD-10-CM | POA: Diagnosis not present

## 2021-07-03 DIAGNOSIS — M542 Cervicalgia: Secondary | ICD-10-CM | POA: Diagnosis not present

## 2021-07-03 DIAGNOSIS — M62830 Muscle spasm of back: Secondary | ICD-10-CM | POA: Diagnosis not present

## 2021-07-03 DIAGNOSIS — M546 Pain in thoracic spine: Secondary | ICD-10-CM | POA: Diagnosis not present

## 2021-07-03 DIAGNOSIS — M545 Low back pain, unspecified: Secondary | ICD-10-CM | POA: Diagnosis not present

## 2021-07-03 DIAGNOSIS — M62838 Other muscle spasm: Secondary | ICD-10-CM | POA: Diagnosis not present

## 2021-07-03 DIAGNOSIS — M9902 Segmental and somatic dysfunction of thoracic region: Secondary | ICD-10-CM | POA: Diagnosis not present

## 2021-07-03 DIAGNOSIS — M9903 Segmental and somatic dysfunction of lumbar region: Secondary | ICD-10-CM | POA: Diagnosis not present

## 2021-07-03 DIAGNOSIS — M9901 Segmental and somatic dysfunction of cervical region: Secondary | ICD-10-CM | POA: Diagnosis not present

## 2021-07-07 DIAGNOSIS — M9901 Segmental and somatic dysfunction of cervical region: Secondary | ICD-10-CM | POA: Diagnosis not present

## 2021-07-07 DIAGNOSIS — M62838 Other muscle spasm: Secondary | ICD-10-CM | POA: Diagnosis not present

## 2021-07-07 DIAGNOSIS — M545 Low back pain, unspecified: Secondary | ICD-10-CM | POA: Diagnosis not present

## 2021-07-07 DIAGNOSIS — M542 Cervicalgia: Secondary | ICD-10-CM | POA: Diagnosis not present

## 2021-07-07 DIAGNOSIS — M62830 Muscle spasm of back: Secondary | ICD-10-CM | POA: Diagnosis not present

## 2021-07-07 DIAGNOSIS — M546 Pain in thoracic spine: Secondary | ICD-10-CM | POA: Diagnosis not present

## 2021-07-07 DIAGNOSIS — M9903 Segmental and somatic dysfunction of lumbar region: Secondary | ICD-10-CM | POA: Diagnosis not present

## 2021-07-07 DIAGNOSIS — M9902 Segmental and somatic dysfunction of thoracic region: Secondary | ICD-10-CM | POA: Diagnosis not present

## 2021-07-09 DIAGNOSIS — M545 Low back pain, unspecified: Secondary | ICD-10-CM | POA: Diagnosis not present

## 2021-07-09 DIAGNOSIS — M62830 Muscle spasm of back: Secondary | ICD-10-CM | POA: Diagnosis not present

## 2021-07-09 DIAGNOSIS — M542 Cervicalgia: Secondary | ICD-10-CM | POA: Diagnosis not present

## 2021-07-09 DIAGNOSIS — M9901 Segmental and somatic dysfunction of cervical region: Secondary | ICD-10-CM | POA: Diagnosis not present

## 2021-07-09 DIAGNOSIS — M9902 Segmental and somatic dysfunction of thoracic region: Secondary | ICD-10-CM | POA: Diagnosis not present

## 2021-07-09 DIAGNOSIS — M62838 Other muscle spasm: Secondary | ICD-10-CM | POA: Diagnosis not present

## 2021-07-09 DIAGNOSIS — M9903 Segmental and somatic dysfunction of lumbar region: Secondary | ICD-10-CM | POA: Diagnosis not present

## 2021-07-09 DIAGNOSIS — M546 Pain in thoracic spine: Secondary | ICD-10-CM | POA: Diagnosis not present

## 2021-07-10 DIAGNOSIS — M9903 Segmental and somatic dysfunction of lumbar region: Secondary | ICD-10-CM | POA: Diagnosis not present

## 2021-07-10 DIAGNOSIS — M9902 Segmental and somatic dysfunction of thoracic region: Secondary | ICD-10-CM | POA: Diagnosis not present

## 2021-07-10 DIAGNOSIS — M545 Low back pain, unspecified: Secondary | ICD-10-CM | POA: Diagnosis not present

## 2021-07-10 DIAGNOSIS — M9901 Segmental and somatic dysfunction of cervical region: Secondary | ICD-10-CM | POA: Diagnosis not present

## 2021-07-10 DIAGNOSIS — M62838 Other muscle spasm: Secondary | ICD-10-CM | POA: Diagnosis not present

## 2021-07-10 DIAGNOSIS — M542 Cervicalgia: Secondary | ICD-10-CM | POA: Diagnosis not present

## 2021-07-10 DIAGNOSIS — M546 Pain in thoracic spine: Secondary | ICD-10-CM | POA: Diagnosis not present

## 2021-07-10 DIAGNOSIS — M62830 Muscle spasm of back: Secondary | ICD-10-CM | POA: Diagnosis not present

## 2021-07-14 ENCOUNTER — Other Ambulatory Visit: Payer: Self-pay

## 2021-07-16 ENCOUNTER — Other Ambulatory Visit: Payer: Self-pay | Admitting: Medical-Surgical

## 2021-07-16 ENCOUNTER — Other Ambulatory Visit (HOSPITAL_COMMUNITY): Payer: Self-pay

## 2021-07-16 ENCOUNTER — Other Ambulatory Visit: Payer: Self-pay

## 2021-07-16 ENCOUNTER — Encounter (INDEPENDENT_AMBULATORY_CARE_PROVIDER_SITE_OTHER): Payer: Self-pay

## 2021-07-16 MED ORDER — METFORMIN HCL 500 MG PO TABS
500.0000 mg | ORAL_TABLET | Freq: Two times a day (BID) | ORAL | 1 refills | Status: DC
  Filled 2021-07-16: qty 180, 90d supply, fill #0
  Filled 2021-11-12: qty 180, 90d supply, fill #1

## 2021-07-24 ENCOUNTER — Other Ambulatory Visit (HOSPITAL_COMMUNITY): Payer: Self-pay

## 2021-07-25 ENCOUNTER — Other Ambulatory Visit (HOSPITAL_COMMUNITY): Payer: Self-pay

## 2021-07-31 ENCOUNTER — Other Ambulatory Visit (HOSPITAL_COMMUNITY)
Admission: RE | Admit: 2021-07-31 | Discharge: 2021-07-31 | Disposition: A | Discharge status: Home / Self Care | Payer: 59 | Source: Ambulatory Visit | Attending: Obstetrics and Gynecology | Admitting: Obstetrics and Gynecology

## 2021-07-31 ENCOUNTER — Other Ambulatory Visit: Payer: Self-pay | Admitting: *Deleted

## 2021-07-31 ENCOUNTER — Encounter: Payer: Self-pay | Admitting: Obstetrics and Gynecology

## 2021-07-31 ENCOUNTER — Ambulatory Visit: Payer: 59 | Admitting: Obstetrics and Gynecology

## 2021-07-31 VITALS — BP 130/73 | HR 48 | Ht 64.0 in | Wt 217.0 lb

## 2021-07-31 DIAGNOSIS — Z01419 Encounter for gynecological examination (general) (routine) without abnormal findings: Secondary | ICD-10-CM | POA: Insufficient documentation

## 2021-07-31 DIAGNOSIS — N841 Polyp of cervix uteri: Secondary | ICD-10-CM | POA: Diagnosis not present

## 2021-07-31 DIAGNOSIS — N879 Dysplasia of cervix uteri, unspecified: Secondary | ICD-10-CM | POA: Diagnosis not present

## 2021-07-31 DIAGNOSIS — N883 Incompetence of cervix uteri: Secondary | ICD-10-CM | POA: Diagnosis not present

## 2021-07-31 NOTE — Progress Notes (Signed)
Pt has not had gyn exam in several years. ?Pt states she thinks she had partial hyst in 2007. ? ?

## 2021-07-31 NOTE — Progress Notes (Signed)
Subjective:  ?  ? Alicia Pineda is a 66 y.o. female with BMI 37 s/p hysterectomy in 2007 who is here for a comprehensive physical exam. The patient reports no problems. She denies any episodes of vaginal bleeding. Hysterectomy performed due to fibroid and menorrhagia. Patient denies any pelvic pain or abnormal discharge. She is not sexually active. She denies urinary incontinence. She reports regular bowel movements ? ?Past Medical History:  ?Diagnosis Date  ? COVID-19 04/2020  ? also had in 2020  ? Diabetes mellitus without complication (HCC)   ? Hypertension   ? ?Past Surgical History:  ?Procedure Laterality Date  ? ABDOMINAL HYSTERECTOMY    ? 2007  ? CATARACT EXTRACTION    ? CHOLECYSTECTOMY    ? TUBAL LIGATION    ? ? ?Social History  ? ?Socioeconomic History  ? Marital status: Divorced  ?  Spouse name: Not on file  ? Number of children: Not on file  ? Years of education: Not on file  ? Highest education level: Not on file  ?Occupational History  ? Not on file  ?Tobacco Use  ? Smoking status: Never  ? Smokeless tobacco: Never  ?Vaping Use  ? Vaping Use: Not on file  ?Substance and Sexual Activity  ? Alcohol use: Yes  ? Drug use: Never  ? Sexual activity: Not Currently  ?Other Topics Concern  ? Not on file  ?Social History Narrative  ? Not on file  ? ?Social Determinants of Health  ? ?Financial Resource Strain: Not on file  ?Food Insecurity: Not on file  ?Transportation Needs: Not on file  ?Physical Activity: Not on file  ?Stress: Not on file  ?Social Connections: Not on file  ?Intimate Partner Violence: Not on file  ? ?Health Maintenance  ?Topic Date Due  ? OPHTHALMOLOGY EXAM  Never done  ? PAP SMEAR-Modifier  Never done  ? COVID-19 Vaccine (6 - Booster for Pfizer series) 08/22/2020  ? INFLUENZA VACCINE  11/04/2021  ? HEMOGLOBIN A1C  12/17/2021  ? FOOT EXAM  03/05/2022  ? MAMMOGRAM  03/06/2023  ? TETANUS/TDAP  12/05/2030  ? COLONOSCOPY (Pts 45-57yrs Insurance coverage will need to be confirmed)  06/12/2031  ?  Hepatitis C Screening  Completed  ? HIV Screening  Completed  ? Zoster Vaccines- Shingrix  Completed  ? HPV VACCINES  Aged Out  ? ? ?  ? ?Review of Systems ?Pertinent items noted in HPI and remainder of comprehensive ROS otherwise negative.  ? ?Objective:  ?Blood pressure 130/73, pulse (!) 48, height 5\' 4"  (1.626 m), weight 217 lb (98.4 kg). ? ? GENERAL: Well-developed, well-nourished female in no acute distress.  ?HEENT: Normocephalic, atraumatic. Sclerae anicteric.  ?NECK: Supple. Normal thyroid.  ?LUNGS: Clear to auscultation bilaterally.  ?HEART: Regular rate and rhythm. ?BREASTS: Symmetric in size. No palpable masses or lymphadenopathy, skin changes, or nipple drainage. ?ABDOMEN: Soft, nontender, nondistended. No organomegaly. ?PELVIC: Normal external female genitalia. Vagina is pink and rugated.  Normal discharge. Normal appearing cervix with a 3 cm polypoid mass on a stalk protruding. No adnexal mass or tenderness. Chaperone present during the pelvic exam ?EXTREMITIES: No cyanosis, clubbing, or edema, 2+ distal pulses. ?  ?  ?Assessment:  ? ? Healthy female exam.    ?  ?Plan:  ? ? Patient current on mammogram and colonoscopy ?Pap smear collected ?Polypoid mass removed with a ring forceps without difficulty ?Patient will be contacted with abnormal results ?See After Visit Summary for Counseling Recommendations  ? ?

## 2021-08-01 ENCOUNTER — Other Ambulatory Visit (HOSPITAL_COMMUNITY): Payer: Self-pay

## 2021-08-04 LAB — SURGICAL PATHOLOGY

## 2021-08-06 LAB — CYTOLOGY - PAP
Comment: NEGATIVE
Diagnosis: NEGATIVE
High risk HPV: NEGATIVE

## 2021-08-11 ENCOUNTER — Other Ambulatory Visit: Payer: Self-pay

## 2021-08-15 ENCOUNTER — Other Ambulatory Visit: Payer: Self-pay

## 2021-08-18 ENCOUNTER — Other Ambulatory Visit: Payer: Self-pay

## 2021-08-29 ENCOUNTER — Other Ambulatory Visit: Payer: Self-pay | Admitting: Medical-Surgical

## 2021-08-29 ENCOUNTER — Other Ambulatory Visit: Payer: Self-pay

## 2021-08-29 MED ORDER — MOUNJARO 5 MG/0.5ML ~~LOC~~ SOAJ
5.0000 mg | SUBCUTANEOUS | 0 refills | Status: DC
  Filled 2021-08-29 – 2021-09-03 (×2): qty 6, 84d supply, fill #0

## 2021-09-03 ENCOUNTER — Other Ambulatory Visit: Payer: Self-pay

## 2021-09-16 ENCOUNTER — Other Ambulatory Visit: Payer: Self-pay | Admitting: Internal Medicine

## 2021-09-16 ENCOUNTER — Other Ambulatory Visit: Payer: Self-pay | Admitting: Medical-Surgical

## 2021-09-17 ENCOUNTER — Other Ambulatory Visit: Payer: Self-pay

## 2021-09-17 MED ORDER — ESOMEPRAZOLE MAGNESIUM 40 MG PO CPDR
40.0000 mg | DELAYED_RELEASE_CAPSULE | Freq: Every day | ORAL | 3 refills | Status: DC
  Filled 2021-09-17: qty 30, 30d supply, fill #0
  Filled 2021-10-12: qty 30, 30d supply, fill #1
  Filled 2021-11-12: qty 30, 30d supply, fill #2
  Filled 2021-12-11: qty 30, 30d supply, fill #3

## 2021-09-18 ENCOUNTER — Other Ambulatory Visit: Payer: Self-pay

## 2021-09-18 MED ORDER — DAPAGLIFLOZIN PROPANEDIOL 10 MG PO TABS
10.0000 mg | ORAL_TABLET | Freq: Every day | ORAL | 0 refills | Status: DC
  Filled 2021-09-18: qty 90, 90d supply, fill #0

## 2021-10-13 ENCOUNTER — Other Ambulatory Visit: Payer: Self-pay

## 2021-11-12 ENCOUNTER — Other Ambulatory Visit: Payer: Self-pay | Admitting: Medical-Surgical

## 2021-11-13 ENCOUNTER — Other Ambulatory Visit: Payer: Self-pay

## 2021-11-13 MED ORDER — FLUOXETINE HCL 40 MG PO CAPS
40.0000 mg | ORAL_CAPSULE | Freq: Every day | ORAL | 1 refills | Status: DC
  Filled 2021-11-13: qty 90, 90d supply, fill #0
  Filled 2022-02-10: qty 41, 41d supply, fill #1
  Filled 2022-02-10: qty 90, 90d supply, fill #1
  Filled 2022-02-10: qty 49, 49d supply, fill #1

## 2021-11-13 MED ORDER — CARVEDILOL 25 MG PO TABS
25.0000 mg | ORAL_TABLET | Freq: Two times a day (BID) | ORAL | 1 refills | Status: DC
  Filled 2021-11-13: qty 180, 90d supply, fill #0
  Filled 2022-02-10: qty 180, 90d supply, fill #1

## 2021-11-18 ENCOUNTER — Other Ambulatory Visit: Payer: Self-pay

## 2021-11-25 NOTE — Progress Notes (Unsigned)
   Established Patient Office Visit  Subjective   Patient ID: Alicia Pineda, female   DOB: [birth year] Age: 66 y.o. MRN: [id number]   No chief complaint on file.   HPI Pleasant 66 year old female presenting today for the following:  DM:  HTN:  HLD:  Arm/hand pain:   Objective:    There were no vitals filed for this visit.  Physical Exam   No results found for this or any previous visit (from the past 24 hour(s)).   {Labs (Optional):23779}  The ASCVD Risk score (Arnett DK, et al., 2019) failed to calculate for the following reasons:   The valid total cholesterol range is 130 to 320 mg/dL   Assessment & Plan:   No problem-specific Assessment & Plan notes found for this encounter.   No follow-ups on file.  ___________________________________________ Thayer Ohm, DNP, APRN, FNP-BC Primary Care and Sports Medicine [facility] [place]

## 2021-11-27 ENCOUNTER — Ambulatory Visit (INDEPENDENT_AMBULATORY_CARE_PROVIDER_SITE_OTHER): Payer: 59

## 2021-11-27 ENCOUNTER — Other Ambulatory Visit: Payer: Self-pay

## 2021-11-27 ENCOUNTER — Ambulatory Visit: Payer: 59 | Admitting: Medical-Surgical

## 2021-11-27 ENCOUNTER — Encounter: Payer: Self-pay | Admitting: Medical-Surgical

## 2021-11-27 VITALS — BP 133/64 | HR 52 | Resp 20 | Ht 64.0 in | Wt 210.0 lb

## 2021-11-27 DIAGNOSIS — I10 Essential (primary) hypertension: Secondary | ICD-10-CM | POA: Diagnosis not present

## 2021-11-27 DIAGNOSIS — M5441 Lumbago with sciatica, right side: Secondary | ICD-10-CM

## 2021-11-27 DIAGNOSIS — Z23 Encounter for immunization: Secondary | ICD-10-CM

## 2021-11-27 DIAGNOSIS — M5137 Other intervertebral disc degeneration, lumbosacral region: Secondary | ICD-10-CM | POA: Diagnosis not present

## 2021-11-27 DIAGNOSIS — M5442 Lumbago with sciatica, left side: Secondary | ICD-10-CM

## 2021-11-27 DIAGNOSIS — G8929 Other chronic pain: Secondary | ICD-10-CM | POA: Diagnosis not present

## 2021-11-27 DIAGNOSIS — M545 Low back pain, unspecified: Secondary | ICD-10-CM | POA: Diagnosis not present

## 2021-11-27 DIAGNOSIS — E781 Pure hyperglyceridemia: Secondary | ICD-10-CM

## 2021-11-27 DIAGNOSIS — E1165 Type 2 diabetes mellitus with hyperglycemia: Secondary | ICD-10-CM

## 2021-11-27 LAB — POCT GLYCOSYLATED HEMOGLOBIN (HGB A1C): HbA1c, POC (controlled diabetic range): 5.3 % (ref 0.0–7.0)

## 2021-11-27 LAB — POCT UA - MICROALBUMIN
Albumin/Creatinine Ratio, Urine, POC: <30
Creatinine, POC: 10 mg/dL
Microalbumin Ur, POC: 10 mg/L

## 2021-11-27 MED ORDER — MELOXICAM 15 MG PO TABS
15.0000 mg | ORAL_TABLET | Freq: Every day | ORAL | 0 refills | Status: DC
  Filled 2021-11-27: qty 30, 30d supply, fill #0

## 2021-11-27 MED ORDER — GLIPIZIDE 5 MG PO TABS
5.0000 mg | ORAL_TABLET | Freq: Two times a day (BID) | ORAL | 1 refills | Status: DC
Start: 2021-11-27 — End: 2022-04-21
  Filled 2021-11-27: qty 180, 90d supply, fill #0
  Filled 2022-02-10: qty 180, 90d supply, fill #1

## 2021-11-27 MED ORDER — TIRZEPATIDE 7.5 MG/0.5ML ~~LOC~~ SOAJ
7.5000 mg | SUBCUTANEOUS | 1 refills | Status: DC
  Filled 2021-11-27: qty 6, 84d supply, fill #0
  Filled 2022-02-15 – 2022-02-16 (×2): qty 6, 84d supply, fill #1

## 2021-11-28 ENCOUNTER — Other Ambulatory Visit: Payer: Self-pay

## 2021-11-28 ENCOUNTER — Other Ambulatory Visit: Payer: Self-pay | Admitting: Medical-Surgical

## 2021-11-28 LAB — COMPLETE METABOLIC PANEL WITH GFR
AG Ratio: 1.6 (calc) (ref 1.0–2.5)
ALT: 16 U/L (ref 6–29)
AST: 17 U/L (ref 10–35)
Albumin: 4 g/dL (ref 3.6–5.1)
Alkaline phosphatase (APISO): 132 U/L (ref 37–153)
BUN/Creatinine Ratio: 44 (calc) — ABNORMAL HIGH (ref 6–22)
BUN: 27 mg/dL — ABNORMAL HIGH (ref 7–25)
CO2: 22 mmol/L (ref 20–32)
Calcium: 9.4 mg/dL (ref 8.6–10.4)
Chloride: 105 mmol/L (ref 98–110)
Creat: 0.61 mg/dL (ref 0.50–1.05)
Globulin: 2.5 g/dL (calc) (ref 1.9–3.7)
Glucose, Bld: 64 mg/dL — ABNORMAL LOW (ref 65–99)
Potassium: 4.1 mmol/L (ref 3.5–5.3)
Sodium: 138 mmol/L (ref 135–146)
Total Bilirubin: 0.3 mg/dL (ref 0.2–1.2)
Total Protein: 6.5 g/dL (ref 6.1–8.1)
eGFR: 100 mL/min/{1.73_m2} (ref >=60)

## 2021-11-28 LAB — LIPID PANEL
Cholesterol: 125 mg/dL (ref <200)
HDL: 47 mg/dL — ABNORMAL LOW (ref >=50)
LDL Cholesterol (Calc): 60 mg/dL (calc)
Non-HDL Cholesterol (Calc): 78 mg/dL (calc) (ref <130)
Total CHOL/HDL Ratio: 2.7 (calc) (ref <5.0)
Triglycerides: 97 mg/dL (ref <150)

## 2021-11-28 LAB — CBC WITH DIFFERENTIAL/PLATELET
Absolute Monocytes: 522 cells/uL (ref 200–950)
Basophils Absolute: 26 cells/uL (ref 0–200)
Basophils Relative: 0.3 %
Eosinophils Absolute: 348 cells/uL (ref 15–500)
Eosinophils Relative: 4 %
HCT: 37.1 % (ref 35.0–45.0)
Hemoglobin: 12.3 g/dL (ref 11.7–15.5)
Lymphs Abs: 2332 cells/uL (ref 850–3900)
MCH: 29.1 pg (ref 27.0–33.0)
MCHC: 33.2 g/dL (ref 32.0–36.0)
MCV: 87.9 fL (ref 80.0–100.0)
MPV: 11.1 fL (ref 7.5–12.5)
Monocytes Relative: 6 %
Neutro Abs: 5472 cells/uL (ref 1500–7800)
Neutrophils Relative %: 62.9 %
Platelets: 260 10*3/uL (ref 140–400)
RBC: 4.22 10*6/uL (ref 3.80–5.10)
RDW: 14.2 % (ref 11.0–15.0)
Total Lymphocyte: 26.8 %
WBC: 8.7 10*3/uL (ref 3.8–10.8)

## 2021-11-28 MED ORDER — LOSARTAN POTASSIUM 50 MG PO TABS
50.0000 mg | ORAL_TABLET | Freq: Every day | ORAL | 1 refills | Status: DC
  Filled 2021-11-28: qty 90, 90d supply, fill #0
  Filled 2022-03-01: qty 90, 90d supply, fill #1

## 2021-12-01 ENCOUNTER — Other Ambulatory Visit: Payer: Self-pay

## 2021-12-02 ENCOUNTER — Other Ambulatory Visit: Payer: Self-pay

## 2021-12-11 ENCOUNTER — Other Ambulatory Visit: Payer: Self-pay | Admitting: Medical-Surgical

## 2021-12-11 ENCOUNTER — Other Ambulatory Visit: Payer: Self-pay

## 2021-12-15 ENCOUNTER — Other Ambulatory Visit: Payer: Self-pay | Admitting: Medical-Surgical

## 2021-12-15 ENCOUNTER — Other Ambulatory Visit: Payer: Self-pay

## 2021-12-15 MED ORDER — DAPAGLIFLOZIN PROPANEDIOL 10 MG PO TABS
10.0000 mg | ORAL_TABLET | Freq: Every day | ORAL | 0 refills | Status: DC
  Filled 2021-12-15 – 2021-12-24 (×2): qty 90, 90d supply, fill #0

## 2021-12-16 ENCOUNTER — Other Ambulatory Visit: Payer: Self-pay

## 2021-12-17 ENCOUNTER — Ambulatory Visit: Payer: 59 | Admitting: Medical-Surgical

## 2021-12-22 ENCOUNTER — Other Ambulatory Visit: Payer: Self-pay

## 2021-12-24 ENCOUNTER — Other Ambulatory Visit: Payer: Self-pay

## 2021-12-24 ENCOUNTER — Other Ambulatory Visit: Payer: Self-pay | Admitting: Medical-Surgical

## 2021-12-24 MED ORDER — AMLODIPINE BESYLATE 10 MG PO TABS
10.0000 mg | ORAL_TABLET | Freq: Every day | ORAL | 1 refills | Status: DC
  Filled 2021-12-24: qty 90, 90d supply, fill #0
  Filled 2022-03-27 – 2022-04-05 (×2): qty 90, 90d supply, fill #1

## 2021-12-25 ENCOUNTER — Other Ambulatory Visit: Payer: Self-pay

## 2022-01-05 ENCOUNTER — Other Ambulatory Visit: Payer: Self-pay | Admitting: Medical-Surgical

## 2022-01-05 ENCOUNTER — Other Ambulatory Visit: Payer: Self-pay

## 2022-01-05 ENCOUNTER — Encounter: Payer: Self-pay | Admitting: Medical-Surgical

## 2022-01-05 DIAGNOSIS — G8929 Other chronic pain: Secondary | ICD-10-CM

## 2022-01-05 MED ORDER — MELOXICAM 15 MG PO TABS
15.0000 mg | ORAL_TABLET | Freq: Every day | ORAL | 0 refills | Status: DC
  Filled 2022-01-05: qty 30, 30d supply, fill #0

## 2022-01-08 ENCOUNTER — Other Ambulatory Visit: Payer: Self-pay

## 2022-01-12 ENCOUNTER — Other Ambulatory Visit: Payer: Self-pay | Admitting: Internal Medicine

## 2022-01-12 ENCOUNTER — Other Ambulatory Visit: Payer: Self-pay

## 2022-01-12 MED ORDER — ESOMEPRAZOLE MAGNESIUM 40 MG PO CPDR
40.0000 mg | DELAYED_RELEASE_CAPSULE | Freq: Every day | ORAL | 3 refills | Status: DC
  Filled 2022-01-12: qty 30, 30d supply, fill #0
  Filled 2022-02-10: qty 30, 30d supply, fill #1
  Filled 2022-03-11: qty 30, 30d supply, fill #2
  Filled 2022-04-09: qty 30, 30d supply, fill #3

## 2022-01-14 ENCOUNTER — Other Ambulatory Visit: Payer: Self-pay

## 2022-01-28 ENCOUNTER — Ambulatory Visit: Payer: 59 | Attending: Medical-Surgical | Admitting: Physical Therapy

## 2022-01-28 ENCOUNTER — Encounter: Payer: Self-pay | Admitting: Physical Therapy

## 2022-01-28 ENCOUNTER — Other Ambulatory Visit: Payer: Self-pay

## 2022-01-28 DIAGNOSIS — G8929 Other chronic pain: Secondary | ICD-10-CM | POA: Diagnosis not present

## 2022-01-28 DIAGNOSIS — M5441 Lumbago with sciatica, right side: Secondary | ICD-10-CM | POA: Insufficient documentation

## 2022-01-28 DIAGNOSIS — M6281 Muscle weakness (generalized): Secondary | ICD-10-CM | POA: Diagnosis not present

## 2022-01-28 DIAGNOSIS — R29898 Other symptoms and signs involving the musculoskeletal system: Secondary | ICD-10-CM | POA: Diagnosis not present

## 2022-01-28 DIAGNOSIS — M5442 Lumbago with sciatica, left side: Secondary | ICD-10-CM | POA: Insufficient documentation

## 2022-01-28 NOTE — Therapy (Signed)
OUTPATIENT PHYSICAL THERAPY THORACOLUMBAR EVALUATION   Patient Name: Alicia Pineda MRN: [id number] DOB:[birth year], 66 y.o., female Today's Date: 01/28/2022   PT End of Session - 01/28/22 1010     Visit Number 1    Number of Visits 12    Date for PT Re-Evaluation 03/11/22    PT Start Time 0931    PT Stop Time 1010    PT Time Calculation (min) 39 min    Activity Tolerance Patient tolerated treatment well    Behavior During Therapy [facility] for tasks assessed/performed             Past Medical History:  Diagnosis Date   COVID-19 04/2020   also had in 2020   Diabetes mellitus without complication (HCC)    Hypertension    Past Surgical History:  Procedure Laterality Date   ABDOMINAL HYSTERECTOMY     2007   CATARACT EXTRACTION     CHOLECYSTECTOMY     TUBAL LIGATION     Patient Active Problem List   Diagnosis Date Noted   Type 2 diabetes mellitus with hyperglycemia, without long-term current use of insulin (HCC) 03/05/2021   Essential hypertension 03/05/2021   Hypertriglyceridemia 03/05/2021    REFERRING PROVIDER: Christen Butter  REFERRING DIAG: chronic bilateral low back pain  Rationale for Evaluation and Treatment Rehabilitation  THERAPY DIAG:  Other symptoms and signs involving the musculoskeletal system  Muscle weakness (generalized)  ONSET DATE: 01/2021  SUBJECTIVE:                                                                                                                                                                                           SUBJECTIVE STATEMENT: For almost 1 year pt has been having pain in bilat hips and burning down bilat LEs. She used to walk frequently for exercise but now is unable due to onset of burning and tingling down LEs. Pain is worse with prolonged standing, eases with icy hot  PERTINENT HISTORY:  Rt LE injury years ago  PAIN:  Are you having pain?  Pt with no c/o pain while seated on eval. States burning/tingling gets  to a 7/10 with prolonged standing  PRECAUTIONS: None  WEIGHT BEARING RESTRICTIONS: No  FALLS:  Has patient fallen in last 6 months? 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  PATIENT SURVEYS:  FOTO 28   COGNITION: Overall cognitive status: Within functional limits for tasks assessed     SENSATION: Burning/tingling in LEs  with prolonged standing  MUSCLE LENGTH: Hamstrings: WFL bilat   PALPATION: Hypomobile CPAs and UPAs sacrum and L4/5, increased mm spasticity bilat glutes  LUMBAR ROM:   AROM eval  Flexion WFL  Extension WFL  Right lateral flexion WFL  Left lateral flexion WFL  Right rotation WFL  Left rotation WFL   (Blank rows = not tested)     LOWER EXTREMITY MMT:    MMT Right eval Left eval  Hip flexion 4 4  Hip extension 4 4  Hip abduction 3+ 4  Hip adduction    Hip internal rotation    Hip external rotation    Knee flexion    Knee extension    Ankle dorsiflexion    Ankle plantarflexion    Ankle inversion    Ankle eversion     (Blank rows = not tested)  LUMBAR SPECIAL TESTS:  Slump test (+) Lt FABER(-) bilat    TODAY'S TREATMENT:                                                                                                                               Eval: Sciatic nerve glide seated x 2 bilat Side step red TB Sit <> stand x 10 Childs pose x 30 sec each direction Seated pelvic tilt x 10  PATIENT EDUCATION:  Education details: PT POC and goals, HEP Person educated: Patient Education method: Programmer, multimedia, Facilities manager, and Handouts Education comprehension: verbalized understanding and returned demonstration  HOME EXERCISE PROGRAM: Access Code: [email or web address] URL: [email or web address] Date: 01/28/2022 Prepared by: Reggy Eye  Exercises -  Child's Pose Stretch  - 1 x daily - 7 x weekly - 1 sets - 3 reps - 20-30 sec hold - Child's Pose with Sidebending  - 1 x daily - 7 x weekly - 1 sets - 3 reps - 20-30 sec hold - Seated Pelvic Tilt  - 1 x daily - 7 x weekly - 2 sets - 10 reps - Sit to Stand Without Arm Support  - 1 x daily - 7 x weekly - 3 sets - 10 reps - Seated Slump Nerve Glide  - 1 x daily - 7 x weekly - 2 sets - 10 reps - Side Stepping with Resistance at Ankles  - 1 x daily - 7 x weekly - 3 sets - 10 reps  ASSESSMENT:  CLINICAL IMPRESSION: Patient is a 66 y.o. female who was seen today for physical therapy evaluation and treatment for low back pain with bilateral sciatica. Pt presents with decreased spinal mobility, decreased strength and activity tolerance, increased burning/tingling.Pt will benefit from skilled PT to address deficits and improve functional mobility.   OBJECTIVE IMPAIRMENTS: decreased activity tolerance, decreased strength, hypomobility, impaired sensation, and pain.   ACTIVITY LIMITATIONS: standing and locomotion level  PARTICIPATION LIMITATIONS: community activity and occupation  PERSONAL FACTORS: Time since onset of injury/illness/exacerbation are also affecting patient's functional outcome.   REHAB POTENTIAL: Good  CLINICAL DECISION MAKING: Evolving/moderate complexity  EVALUATION COMPLEXITY: Moderate   GOALS: Goals reviewed with patient? Yes   LONG TERM GOALS: Target date: 03/11/2022  Pt will be independent in HEP Baseline:  Goal status: INITIAL  2.  Pt will improve FOTO to >= 64 to demo improved functional mobility Baseline:  Goal status: INITIAL  3.  Pt will improve bilat LE strength to 4+/5 to stand and walk with decreased pain Baseline:  Goal status: INITIAL  4.  Pt will tolerate standing x 1 hour with pain <= 1/10 Baseline:  Goal status: INITIAL  5.  Pt will return to walking x 30 minutes with pain <= 1/10 Baseline:  Goal status: INITIAL  PLAN:  PT FREQUENCY:  1-2x/week  PT DURATION: 6 weeks  PLANNED INTERVENTIONS: Therapeutic exercises, Therapeutic activity, Neuromuscular re-education, Balance training, Gait training, Patient/Family education, Self Care, Joint mobilization, Aquatic Therapy, Dry Needling, Cryotherapy, Moist heat, Taping, Ultrasound, Ionotophoresis 4mg /ml Dexamethasone, Manual therapy, and Re-evaluation.  PLAN FOR NEXT SESSION: assess and progress HEP, core and hip strength, spinal mobility   Tamaira Ciriello, PT 01/28/2022, 10:11 AM

## 2022-02-04 ENCOUNTER — Ambulatory Visit: Payer: 59 | Admitting: Physical Therapy

## 2022-02-05 ENCOUNTER — Other Ambulatory Visit: Payer: Self-pay

## 2022-02-05 ENCOUNTER — Other Ambulatory Visit: Payer: Self-pay | Admitting: Medical-Surgical

## 2022-02-05 MED ORDER — MELOXICAM 15 MG PO TABS
15.0000 mg | ORAL_TABLET | Freq: Every day | ORAL | 0 refills | Status: DC
  Filled 2022-02-05: qty 30, 30d supply, fill #0

## 2022-02-10 ENCOUNTER — Other Ambulatory Visit: Payer: Self-pay

## 2022-02-10 NOTE — Therapy (Signed)
OUTPATIENT PHYSICAL THERAPY THORACOLUMBAR EVALUATION   Patient Name: Alicia Pineda MRN: [id number] DOB:12/23/1955, 66 y.o., female Today's Date: 02/12/2022   PT End of Session - 02/12/22 0849     Visit Number 2    Number of Visits 12    Date for PT Re-Evaluation 03/11/22    PT Start Time 0848    PT Stop Time 0928    PT Time Calculation (min) 40 min    Activity Tolerance Patient tolerated treatment well    Behavior During Therapy [facility] for tasks assessed/performed              Past Medical History:  Diagnosis Date   COVID-19 04/2020   also had in 2020   Diabetes mellitus without complication ([place])    Hypertension    Past Surgical History:  Procedure Laterality Date   ABDOMINAL HYSTERECTOMY     2007   CATARACT EXTRACTION     CHOLECYSTECTOMY     TUBAL LIGATION     Patient Active Problem List   Diagnosis Date Noted   Type 2 diabetes mellitus with hyperglycemia, without long-term current use of insulin ([place]) 03/05/2021   Essential hypertension 03/05/2021   Hypertriglyceridemia 03/05/2021    REFERRING PROVIDER: Samuel Bouche  REFERRING DIAG: chronic bilateral low back pain  Rationale for Evaluation and Treatment Rehabilitation  THERAPY DIAG:  Other symptoms and signs involving the musculoskeletal system  Muscle weakness (generalized)  ONSET DATE: 01/2021  SUBJECTIVE:                                                                                                                                                                                           SUBJECTIVE STATEMENT: Just got off a 12 hour shift so a little achy in the legs. No back pain. Feels upper thighs and hips when I'm at work.   PERTINENT HISTORY:  Rt LE injury years ago  PAIN:  Are you having pain? No  PRECAUTIONS: None  WEIGHT BEARING RESTRICTIONS: No  FALLS:  Has patient fallen in last 6 months? 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  PATIENT SURVEYS:  FOTO 55   COGNITION: Overall cognitive status: Within functional limits for tasks assessed     SENSATION: Burning/tingling in LEs with prolonged standing  MUSCLE LENGTH: Hamstrings: WFL bilat   PALPATION: Hypomobile CPAs and UPAs sacrum and L4/5, increased mm spasticity bilat glutes  LUMBAR ROM:   AROM eval  Flexion [facility]  Extension [facility]  Right lateral flexion WFL  Left lateral flexion WFL  Right rotation WFL  Left rotation WFL   (Blank rows = not tested)     LOWER EXTREMITY MMT:    MMT Right eval Left eval  Hip flexion 4 4  Hip extension 4 4  Hip abduction 3+ 4  Hip adduction    Hip internal rotation    Hip external rotation    Knee flexion    Knee extension    Ankle dorsiflexion    Ankle plantarflexion    Ankle inversion    Ankle eversion     (Blank rows = not tested)  LUMBAR SPECIAL TESTS:  Slump test (+) Lt FABER(-) bilat    TODAY'S TREATMENT:                                                                                                                               02/12/22 Nustep L5 x 5 min Seated fig 4 2x 30 sec each Seated lumbar pelvic tilt 2x 20 Seated Slump nerve glide 5 sec hold/5 sec release x 10 ea side Sit to stand  2x10 Side stepping RTB x 20 Child's pose, R/L 30 sec ea Prone hip ext with pelvic press x 10 ea Quadriped alt leg lift x 10 ea Standing hip ABD, ext, hip flex (knee bent) RTB 1x15 Fig 7 stretch at sink for traction of spine 2 x 20 sec  01/28/22 Eval: Sciatic nerve glide seated x 2 bilat Side step red TB Sit <> stand x 10 Childs pose x 30 sec each direction Seated pelvic tilt x 10  PATIENT EDUCATION:  Education details: PT POC and goals, HEP Person educated: Patient Education method: Consulting civil engineer, Media planner, and Handouts Education comprehension: verbalized  understanding and returned demonstration  HOME EXERCISE PROGRAM: Access Code: [email or web address] URL: [email or web address] Date: 01/28/2022 Prepared by: Isabelle Course  Exercises - Child's Pose Stretch  - 1 x daily - 7 x weekly - 1 sets - 3 reps - 20-30 sec hold - Child's Pose with Sidebending  - 1 x daily - 7 x weekly - 1 sets - 3 reps - 20-30 sec hold - Seated Pelvic Tilt  - 1 x daily - 7 x weekly - 2 sets - 10 reps - Sit to Stand Without Arm Support  - 1 x daily - 7 x weekly - 3 sets - 10 reps - Seated Slump Nerve Glide  - 1 x daily - 7 x weekly - 2 sets - 10 reps - Side Stepping with Resistance at Ankles  - 1 x daily - 7 x weekly - 3 sets - 10 reps  ASSESSMENT:  CLINICAL IMPRESSION: Good tolerance to increased exercise today. Pt reports significant relief with forward flexion stretch at sink. Cerise continues to demonstrate potential for improvement and would benefit from continued skilled therapy to address impairments.    OBJECTIVE IMPAIRMENTS: decreased activity tolerance, decreased strength, hypomobility, impaired sensation, and pain.   ACTIVITY LIMITATIONS:  standing and locomotion level  PARTICIPATION LIMITATIONS: community activity and occupation  PERSONAL FACTORS: Time since onset of injury/illness/exacerbation are also affecting patient's functional outcome.   REHAB POTENTIAL: Good  CLINICAL DECISION MAKING: Evolving/moderate complexity  EVALUATION COMPLEXITY: Moderate   GOALS: Goals reviewed with patient? Yes   LONG TERM GOALS: Target date: 03/11/2022  Pt will be independent in HEP Baseline:  Goal status: INITIAL  2.  Pt will improve FOTO to >= 66 to demo improved functional mobility Baseline:  Goal status: INITIAL  3.  Pt will improve bilat LE strength to 4+/5 to stand and walk with decreased pain Baseline:  Goal status: INITIAL  4.  Pt will tolerate standing x 1 hour with pain <= 1/10 Baseline:  Goal status: INITIAL  5.  Pt will return to  walking x 30 minutes with pain <= 1/10 Baseline:  Goal status: INITIAL  PLAN:  PT FREQUENCY: 1-2x/week  PT DURATION: 6 weeks  PLANNED INTERVENTIONS: Therapeutic exercises, Therapeutic activity, Neuromuscular re-education, Balance training, Gait training, Patient/Family education, Self Care, Joint mobilization, Aquatic Therapy, Dry Needling, Cryotherapy, Moist heat, Taping, Ultrasound, Ionotophoresis 4mg /ml Dexamethasone, Manual therapy, and Re-evaluation.  PLAN FOR NEXT SESSION: assess and progress HEP, core and hip strength, spinal mobility   Couper Juncaj, PT 02/12/2022, 9:28 AM

## 2022-02-11 ENCOUNTER — Other Ambulatory Visit: Payer: Self-pay

## 2022-02-12 ENCOUNTER — Ambulatory Visit: Payer: 59 | Attending: Medical-Surgical | Admitting: Physical Therapy

## 2022-02-12 ENCOUNTER — Encounter: Payer: Self-pay | Admitting: Physical Therapy

## 2022-02-12 DIAGNOSIS — R29898 Other symptoms and signs involving the musculoskeletal system: Secondary | ICD-10-CM | POA: Insufficient documentation

## 2022-02-12 DIAGNOSIS — M6281 Muscle weakness (generalized): Secondary | ICD-10-CM | POA: Diagnosis not present

## 2022-02-16 ENCOUNTER — Other Ambulatory Visit: Payer: Self-pay

## 2022-02-17 ENCOUNTER — Other Ambulatory Visit: Payer: Self-pay

## 2022-02-17 ENCOUNTER — Other Ambulatory Visit: Payer: Self-pay | Admitting: Medical-Surgical

## 2022-02-18 NOTE — Therapy (Signed)
OUTPATIENT PHYSICAL THERAPY TREATMENT NOTE   Patient Name: Alicia Pineda MRN: [id number] DOB:March 17, 1956, 66 y.o., female Today's Date: 02/19/2022   PT End of Session - 02/19/22 1400     Visit Number 3    Number of Visits 12    Date for PT Re-Evaluation 03/11/22    PT Start Time 1400    PT Stop Time 1445    PT Time Calculation (min) 45 min    Activity Tolerance Patient tolerated treatment well    Behavior During Therapy [facility] for tasks assessed/performed               Past Medical History:  Diagnosis Date   COVID-19 04/2020   also had in 2020   Diabetes mellitus without complication (HCC)    Hypertension    Past Surgical History:  Procedure Laterality Date   ABDOMINAL HYSTERECTOMY     2007   CATARACT EXTRACTION     CHOLECYSTECTOMY     TUBAL LIGATION     Patient Active Problem List   Diagnosis Date Noted   Type 2 diabetes mellitus with hyperglycemia, without long-term current use of insulin (HCC) 03/05/2021   Essential hypertension 03/05/2021   Hypertriglyceridemia 03/05/2021    REFERRING PROVIDER: Christen Butter  REFERRING DIAG: chronic bilateral low back pain  Rationale for Evaluation and Treatment Rehabilitation  THERAPY DIAG:  Other symptoms and signs involving the musculoskeletal system  Muscle weakness (generalized)  ONSET DATE: 01/2021  SUBJECTIVE:                                                                                                                                                                                           SUBJECTIVE STATEMENT: Pain is off and on. More with doing a lot of standing and the R hip. She has more pain in between her shoulder blades at work.  PERTINENT HISTORY:  Rt LE injury years ago  PAIN:  Are you having pain? No  PRECAUTIONS: None  WEIGHT BEARING RESTRICTIONS: No  FALLS:  Has patient fallen in last 6 months? 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  PATIENT SURVEYS:  FOTO 55   COGNITION: Overall cognitive status: Within functional limits for tasks assessed     SENSATION: Burning/tingling in LEs with prolonged standing  MUSCLE LENGTH: Hamstrings: WFL bilat   PALPATION: Hypomobile CPAs and UPAs sacrum and L4/5, increased mm spasticity bilat glutes  LUMBAR ROM:   AROM eval  Flexion WFL  Extension  WFL  Right lateral flexion WFL  Left lateral flexion WFL  Right rotation WFL  Left rotation WFL   (Blank rows = not tested)     LOWER EXTREMITY MMT:    MMT Right eval Left eval  Hip flexion 4 4  Hip extension 4 4  Hip abduction 3+ 4  Hip adduction    Hip internal rotation    Hip external rotation    Knee flexion    Knee extension    Ankle dorsiflexion    Ankle plantarflexion    Ankle inversion    Ankle eversion     (Blank rows = not tested)  LUMBAR SPECIAL TESTS:  Slump test (+) Lt FABER(-) bilat    TODAY'S TREATMENT:                                                                                                                               02/19/22 Nustep L6 x 5 min Rows and extension 2x10 ea BTB Pallof press double BTB 2x10 Small lumbar rotation irritated into left hip so stopped Fig 7 stretch at sink for traction of spine 2 x 20 sec Lumbar ext at wall 5x 5 sec hold Alt punches 3# with rear foot pivot OH carry 8# and alt arm off side 8# x 4 laps/ 2 each arm Quadriped leg x 10 ea 3 sec hold Quadriped alt arm/leg lift x 5 ea 3 sec hold Hooklying TrAb alt leg march x 5 ea Table top alt toe taps x 5 ea Table top with alt leg press x 5 ea  02/12/22 Nustep L5 x 5 min Seated fig 4 2x 30 sec each Seated lumbar pelvic tilt 2x 20 Seated Slump nerve glide 5 sec hold/5 sec release x 10 ea side Sit to stand  2x10 Side stepping RTB x 20 Child's pose, R/L 30 sec ea Prone hip  ext with pelvic press x 10 ea Quadriped alt leg lift x 10 ea Standing hip ABD, ext, hip flex (knee bent) RTB 1x15 Fig 7 stretch at sink for traction of spine 2 x 20 sec  01/28/22 Eval: Sciatic nerve glide seated x 2 bilat Side step red TB Sit <> stand x 10 Childs pose x 30 sec each direction Seated pelvic tilt x 10  PATIENT EDUCATION:  Education details: HEP update Person educated: Patient Education method: Explanation, Demonstration, and Handouts Education comprehension: verbalized understanding and returned demonstration  HOME EXERCISE PROGRAM: Access Code: [email or web address] URL: [email or web address] Date: 02/19/2022 Prepared by: Almyra Free  Exercises added:  - Hooklying Sequential Leg March and Lower  - 1 x daily - 3 x weekly - 1-3 sets - 10 reps - Supine Bent Leg Lift with Knee Extension  - 1 x daily - 3 x weekly - 1-3 sets - 10 reps  ASSESSMENT:  CLINICAL IMPRESSION: Alicia Pineda is progressing with pain control and is getting pain mainly at end of day. Did some upper back strengthening to address reports of  upper back pain at work. She was able to tolerate more challenging standing stabilization exercises today without pain. She did experience intermittent burning sensation in bil hips after OH carries. Alicia Pineda continues to demonstrate potential for improvement and would benefit from continued skilled therapy to address impairments.     OBJECTIVE IMPAIRMENTS: decreased activity tolerance, decreased strength, hypomobility, impaired sensation, and pain.   ACTIVITY LIMITATIONS: standing and locomotion level  PARTICIPATION LIMITATIONS: community activity and occupation  PERSONAL FACTORS: Time since onset of injury/illness/exacerbation are also affecting patient's functional outcome.   REHAB POTENTIAL: Good  CLINICAL DECISION MAKING: Evolving/moderate complexity  EVALUATION COMPLEXITY: Moderate   GOALS: Goals reviewed with patient? Yes   LONG TERM GOALS: Target date:  03/11/2022  Pt will be independent in HEP Baseline:  Goal status: INITIAL  2.  Pt will improve FOTO to >= 66 to demo improved functional mobility Baseline:  Goal status: INITIAL  3.  Pt will improve bilat LE strength to 4+/5 to stand and walk with decreased pain Baseline:  Goal status: INITIAL  4.  Pt will tolerate standing x 1 hour with pain <= 1/10 Baseline:  Goal status: INITIAL  5.  Pt will return to walking x 30 minutes with pain <= 1/10 Baseline:  Goal status: INITIAL  PLAN:  PT FREQUENCY: 1-2x/week  PT DURATION: 6 weeks  PLANNED INTERVENTIONS: Therapeutic exercises, Therapeutic activity, Neuromuscular re-education, Balance training, Gait training, Patient/Family education, Self Care, Joint mobilization, Aquatic Therapy, Dry Needling, Cryotherapy, Moist heat, Taping, Ultrasound, Ionotophoresis 4mg /ml Dexamethasone, Manual therapy, and Re-evaluation.  PLAN FOR NEXT SESSION: continue ab and stabilization TE, core and hip strength, spinal mobility   Najah Liverman, PT 02/19/2022, 2:56 PM

## 2022-02-19 ENCOUNTER — Other Ambulatory Visit: Payer: Self-pay

## 2022-02-19 ENCOUNTER — Encounter: Payer: Self-pay | Admitting: Physical Therapy

## 2022-02-19 ENCOUNTER — Ambulatory Visit: Payer: 59 | Admitting: Physical Therapy

## 2022-02-19 DIAGNOSIS — R29898 Other symptoms and signs involving the musculoskeletal system: Secondary | ICD-10-CM | POA: Diagnosis not present

## 2022-02-19 DIAGNOSIS — M6281 Muscle weakness (generalized): Secondary | ICD-10-CM

## 2022-02-25 ENCOUNTER — Encounter: Payer: 59 | Admitting: Physical Therapy

## 2022-03-02 ENCOUNTER — Other Ambulatory Visit: Payer: Self-pay

## 2022-03-02 ENCOUNTER — Encounter: Payer: 59 | Admitting: Medical-Surgical

## 2022-03-02 NOTE — Progress Notes (Signed)
Erroneous encounter. Please disregard.

## 2022-03-03 ENCOUNTER — Encounter: Payer: 59 | Admitting: Physical Therapy

## 2022-03-10 ENCOUNTER — Ambulatory Visit: Payer: 59 | Attending: Medical-Surgical | Admitting: Physical Therapy

## 2022-03-10 ENCOUNTER — Encounter: Payer: Self-pay | Admitting: Physical Therapy

## 2022-03-10 DIAGNOSIS — R29898 Other symptoms and signs involving the musculoskeletal system: Secondary | ICD-10-CM | POA: Diagnosis not present

## 2022-03-10 DIAGNOSIS — M6281 Muscle weakness (generalized): Secondary | ICD-10-CM | POA: Insufficient documentation

## 2022-03-10 NOTE — Therapy (Addendum)
OUTPATIENT PHYSICAL THERAPY TREATMENT NOTE AND RECERT AND DISCHARGE   Patient Name: Alicia Pineda MRN: [id number] DOB:July 05, 1955, 66 y.o., female Today's Date: 03/10/2022   PT End of Session - 03/10/22 0919     Visit Number 4    Number of Visits 12    Date for PT Re-Evaluation 04/07/22    PT Start Time 0847    PT Stop Time 0925    PT Time Calculation (min) 38 min    Activity Tolerance Patient tolerated treatment well    Behavior During Therapy [facility] for tasks assessed/performed                Past Medical History:  Diagnosis Date   COVID-19 04/2020   also had in 2020   Diabetes mellitus without complication (HCC)    Hypertension    Past Surgical History:  Procedure Laterality Date   ABDOMINAL HYSTERECTOMY     2007   CATARACT EXTRACTION     CHOLECYSTECTOMY     TUBAL LIGATION     Patient Active Problem List   Diagnosis Date Noted   Type 2 diabetes mellitus with hyperglycemia, without long-term current use of insulin (HCC) 03/05/2021   Essential hypertension 03/05/2021   Hypertriglyceridemia 03/05/2021    REFERRING PROVIDER: Christen Butter  REFERRING DIAG: chronic bilateral low back pain  Rationale for Evaluation and Treatment Rehabilitation  THERAPY DIAG:  Other symptoms and signs involving the musculoskeletal system  Muscle weakness (generalized)  ONSET DATE: 01/2021  SUBJECTIVE:                                                                                                                                                                                           SUBJECTIVE STATEMENT: Pt had a stomach bug this past week so has not done any walking for exercise. She states her back is feeling better. There are some days "I don't even think about it"  PERTINENT HISTORY:  Rt LE injury years ago  PAIN:  Are you having pain? No  PRECAUTIONS: None  WEIGHT BEARING RESTRICTIONS: No  FALLS:  Has patient fallen in last 6 months? No   PATIENT GOALS:  decrease pain to return to walking for exercise   OBJECTIVE:   PATIENT SURVEYS:  FOTO 55    LUMBAR ROM:   AROM eval  Flexion WFL  Extension WFL  Right lateral flexion WFL  Left lateral flexion WFL  Right rotation WFL  Left rotation WFL   (Blank rows = not tested)     LOWER EXTREMITY MMT:    MMT Right eval Left eval Right 12/5 Left 12/5  Hip flexion 4 4 4  4+  Hip extension 4 4 4+ 4+  Hip abduction 3+ 4 4+ 4+  Hip adduction      Hip internal rotation      Hip external rotation      Knee flexion      Knee extension      Ankle dorsiflexion      Ankle plantarflexion      Ankle inversion      Ankle eversion       (Blank rows = not tested)  LUMBAR SPECIAL TESTS:  Slump test (+) Lt FABER(-) bilat    TODAY'S TREATMENT:                                                                                                                               [facility] Adult PT Treatment:                                                DATE: 03/10/22 Therapeutic Exercise: Nustep L5 x 5 min Quadruped LE lift x 10 bilat Quadruped alt UE/LE lift x 5 bilat with cues for glute med Row 20# 2 x 10 Shoulder extension 2 x 10 15# Pallof x 15 bilat 10# Table top toe taps x 10 bilat Transverse ab activation with supine marching x 10 Sit <> stand x 10 with 10# KB Figure 7 stretch at counter 10 sec hold x 10 Trunk extension stretch 10 x 10 sec    02/19/22 Nustep L6 x 5 min Rows and extension 2x10 ea BTB Pallof press double BTB 2x10 Small lumbar rotation irritated into left hip so stopped Fig 7 stretch at sink for traction of spine 2 x 20 sec Lumbar ext at wall 5x 5 sec hold Alt punches 3# with rear foot pivot OH carry 8# and alt arm off side 8# x 4 laps/ 2 each arm Quadriped leg x 10 ea 3 sec hold Quadriped alt arm/leg lift x 5 ea 3 sec hold Hooklying TrAb alt leg march x 5 ea Table top alt toe taps x 5 ea Table top with alt leg press x 5 ea   PATIENT EDUCATION:  Education details:  HEP update Person educated: Patient Education method: Explanation, Demonstration, and Handouts Education comprehension: verbalized understanding and returned demonstration  HOME EXERCISE PROGRAM: Access Code: [email or web address] URL: [email or web address] Date: 02/19/2022 Prepared by: Raynelle Fanning  Exercises added:  - Hooklying Sequential Leg March and Lower  - 1 x daily - 3 x weekly - 1-3 sets - 10 reps - Supine Bent Leg Lift with Knee Extension  - 1 x daily - 3 x weekly - 1-3 sets - 10 reps  ASSESSMENT:  CLINICAL IMPRESSION: Pt is improving core strength and endurance and has improved functional activity tolerance. She continues with Rt hip weakness and decreased walking and standing tolerance and will continue  to benefit from skilled PT to reach remaining goals   GOALS: Goals reviewed with patient? Yes   LONG TERM GOALS: Target date: 04/07/2022  Pt will be independent in HEP Baseline:  Goal status: IN PROGRESS  2.  Pt will improve FOTO to >= 64 to demo improved functional mobility Baseline: 03/10/22 = 63 Goal status: IN PROGRESS  3.  Pt will improve bilat LE strength to 4+/5 to stand and walk with decreased pain Baseline:  Goal status: IN PROGRESS  4.  Pt will tolerate standing x 1 hour with pain <= 1/10 Baseline:  Goal status: IN PROGRESS  5.  Pt will return to walking x 30 minutes with pain <= 1/10 Baseline:  Goal status: IN PROGRESS  PLAN:  PT FREQUENCY: 1-2x/week  PT DURATION: 6 weeks  PLANNED INTERVENTIONS: Therapeutic exercises, Therapeutic activity, Neuromuscular re-education, Balance training, Gait training, Patient/Family education, Self Care, Joint mobilization, Aquatic Therapy, Dry Needling, Cryotherapy, Moist heat, Taping, Ultrasound, Ionotophoresis 4mg /ml Dexamethasone, Manual therapy, and Re-evaluation.  PLAN FOR NEXT SESSION: continue ab and stabilization TE, core and hip strength, spinal mobility  PHYSICAL THERAPY DISCHARGE SUMMARY  Visits from  Start of Care: 4  Current functional level related to goals / functional outcomes: Decreased pain   Remaining deficits: See above   Education / Equipment: HEP   Patient agrees to discharge. Patient goals were partially met. Patient is being discharged due to not returning since the last visit. , PT,DPT01/24/248:53 AM  Deke Tilghman, PT 03/10/2022, 9:19 AM

## 2022-03-11 ENCOUNTER — Other Ambulatory Visit: Payer: Self-pay | Admitting: Medical-Surgical

## 2022-03-12 ENCOUNTER — Other Ambulatory Visit: Payer: Self-pay

## 2022-03-12 MED ORDER — MELOXICAM 15 MG PO TABS
15.0000 mg | ORAL_TABLET | Freq: Every day | ORAL | 0 refills | Status: DC
  Filled 2022-03-12: qty 30, 30d supply, fill #0

## 2022-03-12 MED ORDER — DAPAGLIFLOZIN PROPANEDIOL 10 MG PO TABS
10.0000 mg | ORAL_TABLET | Freq: Every day | ORAL | 0 refills | Status: DC
  Filled 2022-03-12: qty 30, 30d supply, fill #0

## 2022-03-18 ENCOUNTER — Ambulatory Visit: Payer: 59 | Admitting: Physical Therapy

## 2022-04-03 ENCOUNTER — Other Ambulatory Visit: Payer: Self-pay

## 2022-04-05 ENCOUNTER — Other Ambulatory Visit: Payer: Self-pay | Admitting: Medical-Surgical

## 2022-04-06 ENCOUNTER — Other Ambulatory Visit: Payer: Self-pay

## 2022-04-07 ENCOUNTER — Other Ambulatory Visit: Payer: Self-pay

## 2022-04-07 MED ORDER — DAPAGLIFLOZIN PROPANEDIOL 10 MG PO TABS
10.0000 mg | ORAL_TABLET | Freq: Every day | ORAL | 0 refills | Status: DC
  Filled 2022-04-07 – 2022-05-01 (×3): qty 15, 15d supply, fill #0

## 2022-04-07 MED ORDER — MELOXICAM 15 MG PO TABS
15.0000 mg | ORAL_TABLET | Freq: Every day | ORAL | 0 refills | Status: DC
  Filled 2022-04-07 – 2022-04-09 (×2): qty 15, 15d supply, fill #0

## 2022-04-09 ENCOUNTER — Other Ambulatory Visit: Payer: Self-pay

## 2022-04-13 ENCOUNTER — Other Ambulatory Visit: Payer: Self-pay

## 2022-04-21 ENCOUNTER — Other Ambulatory Visit: Payer: Self-pay

## 2022-04-21 ENCOUNTER — Other Ambulatory Visit: Payer: Self-pay | Admitting: Medical-Surgical

## 2022-04-22 ENCOUNTER — Other Ambulatory Visit: Payer: Self-pay

## 2022-04-22 MED ORDER — GLIPIZIDE 5 MG PO TABS
5.0000 mg | ORAL_TABLET | Freq: Two times a day (BID) | ORAL | 1 refills | Status: DC
  Filled 2022-04-22 – 2022-05-04 (×3): qty 180, 90d supply, fill #0
  Filled 2022-07-27 – 2022-07-29 (×2): qty 180, 90d supply, fill #1

## 2022-04-22 MED ORDER — MOUNJARO 7.5 MG/0.5ML ~~LOC~~ SOAJ
7.5000 mg | SUBCUTANEOUS | 0 refills | Status: DC
  Filled 2022-04-22: qty 2, 28d supply, fill #0
  Filled 2022-04-29: qty 6, 84d supply, fill #0

## 2022-04-22 NOTE — Telephone Encounter (Signed)
dapagliflozin propanediol (FARXIGA) 10 MG TABS tablet [[id number]]  patient is requesting refills she is scheduled 05-12-22

## 2022-04-29 ENCOUNTER — Other Ambulatory Visit: Payer: Self-pay

## 2022-04-29 ENCOUNTER — Other Ambulatory Visit: Payer: Self-pay | Admitting: Medical-Surgical

## 2022-04-29 MED ORDER — MELOXICAM 15 MG PO TABS
15.0000 mg | ORAL_TABLET | Freq: Every day | ORAL | 0 refills | Status: DC
  Filled 2022-04-29: qty 15, 15d supply, fill #0

## 2022-04-30 ENCOUNTER — Other Ambulatory Visit: Payer: Self-pay

## 2022-05-01 ENCOUNTER — Other Ambulatory Visit: Payer: Self-pay

## 2022-05-04 ENCOUNTER — Other Ambulatory Visit: Payer: Self-pay

## 2022-05-05 ENCOUNTER — Other Ambulatory Visit: Payer: Self-pay

## 2022-05-07 ENCOUNTER — Other Ambulatory Visit: Payer: Self-pay | Admitting: Internal Medicine

## 2022-05-07 ENCOUNTER — Other Ambulatory Visit: Payer: Self-pay

## 2022-05-07 MED ORDER — ESOMEPRAZOLE MAGNESIUM 40 MG PO CPDR
40.0000 mg | DELAYED_RELEASE_CAPSULE | Freq: Every day | ORAL | 3 refills | Status: DC
  Filled 2022-05-14: qty 30, 30d supply, fill #0
  Filled 2022-06-11: qty 30, 30d supply, fill #1
  Filled 2022-07-14: qty 30, 30d supply, fill #2
  Filled 2022-08-10: qty 30, 30d supply, fill #3

## 2022-05-12 ENCOUNTER — Other Ambulatory Visit: Payer: Self-pay

## 2022-05-12 ENCOUNTER — Encounter: Payer: Self-pay | Admitting: Medical-Surgical

## 2022-05-12 ENCOUNTER — Ambulatory Visit: Payer: Commercial Managed Care - PPO | Admitting: Medical-Surgical

## 2022-05-12 VITALS — BP 143/67 | HR 64 | Resp 20 | Ht 64.0 in | Wt 209.1 lb

## 2022-05-12 DIAGNOSIS — I10 Essential (primary) hypertension: Secondary | ICD-10-CM

## 2022-05-12 DIAGNOSIS — F418 Other specified anxiety disorders: Secondary | ICD-10-CM | POA: Diagnosis not present

## 2022-05-12 DIAGNOSIS — E1165 Type 2 diabetes mellitus with hyperglycemia: Secondary | ICD-10-CM

## 2022-05-12 DIAGNOSIS — E781 Pure hyperglyceridemia: Secondary | ICD-10-CM | POA: Diagnosis not present

## 2022-05-12 LAB — POCT GLYCOSYLATED HEMOGLOBIN (HGB A1C): Hemoglobin A1C: 5.9 % — AB (ref 4.0–5.6)

## 2022-05-12 MED ORDER — CARVEDILOL 12.5 MG PO TABS
12.5000 mg | ORAL_TABLET | Freq: Two times a day (BID) | ORAL | 1 refills | Status: DC
  Filled 2022-05-12: qty 180, 90d supply, fill #0
  Filled 2022-08-10: qty 180, 90d supply, fill #1

## 2022-05-12 MED ORDER — LOSARTAN POTASSIUM 100 MG PO TABS
100.0000 mg | ORAL_TABLET | Freq: Every day | ORAL | 1 refills | Status: DC
  Filled 2022-05-12: qty 90, 90d supply, fill #0
  Filled 2022-08-10: qty 90, 90d supply, fill #1

## 2022-05-12 NOTE — Progress Notes (Signed)
Established Patient Office Visit  Subjective   Patient ID: Alicia Pineda, female   DOB: [birth year] Age: 67 y.o. MRN: [id number]   Chief Complaint  Patient presents with   Follow-up   Diabetes   Hypertension   HPI Pleasant 67 year old female presenting today for the following:  Diabetes: Currently taking Mounjaro 7.5 mg weekly, Farxiga 10 mg daily, and glipizide 5 mg twice daily.  Checking her sugars at home and notes that recently she has been in the 120s.  Admits that her sugars were much better however she "fell off the wagon over the holidays".  Since then she has been working to get back into the regular swing of her diet and lifestyle modifications but notes that it has been hard.  Hypertension: Taking amlodipine 10 mg daily, Coreg 25 mg twice daily, hydrochlorothiazide 25 mg daily, and losartan 50 mg daily.  Tolerating all medications well.  Has noted that her heart rate tends to drop into the 40s while she is working at night on third shift.  This leaves her feeling very fatigued with occasional bouts of dizziness/lightheadedness.  Following a low-sodium diet.  Not regularly exercising.  Denies chest pain, shortness of breath, lower extremity edema, vision changes, or unusual headaches.  Mood: Taking fluoxetine 40 mg daily, tolerating well without side effects.  Notes that the medication works fairly well for her although she does still have some lingering symptoms.  She previously took a higher dose and was able to get down to 40 mg but figures that what is bothering her these days is just life in general rather than anything specific.  Notes that she does not want to go up on her medications at this time.  Denies SI/HI.   Objective:    Vitals:   05/12/22 1522  BP: (!) 143/67  Pulse: 64  Resp: 20  Height: 5\' 4"  (1.626 m)  Weight: 209 lb 1.6 oz (94.8 kg)  SpO2: 96%  BMI (Calculated): 35.87    Physical Exam Vitals reviewed.  Constitutional:      General: She is not in  acute distress.    Appearance: Normal appearance. She is obese. She is not ill-appearing.  HENT:     Head: Normocephalic and atraumatic.  Cardiovascular:     Rate and Rhythm: Normal rate and regular rhythm.     Pulses: Normal pulses.     Heart sounds: Normal heart sounds.  Pulmonary:     Effort: Pulmonary effort is normal. No respiratory distress.     Breath sounds: Normal breath sounds. No wheezing, rhonchi or rales.  Skin:    General: Skin is warm and dry.  Neurological:     Mental Status: She is alert and oriented to person, place, and time.  Psychiatric:        Mood and Affect: Mood normal.        Behavior: Behavior normal.        Thought Content: Thought content normal.        Judgment: Judgment normal.    Results for orders placed or performed in visit on 05/12/22 (from the past 24 hour(s))  POCT HgB A1C     Status: Abnormal   Collection Time: 05/12/22  3:27 PM  Result Value Ref Range   Hemoglobin A1C 5.9 (A) 4.0 - 5.6 %   HbA1c POC (<> result, manual entry)     HbA1c, POC (prediabetic range)     HbA1c, POC (controlled diabetic range)  The ASCVD Risk score (Arnett DK, et al., 2019) failed to calculate for the following reasons:   The valid total cholesterol range is 130 to 320 mg/dL   Assessment & Plan:   1. Type 2 diabetes mellitus with hyperglycemia, without long-term current use of insulin (HCC) POCT hemoglobin A1c 5.9% today.  Continue Mounjaro 7.5 mg weekly, Farxiga 10 mg daily, and glipizide 5 mg twice daily.  Discussed getting back to her regular dietary and lifestyle modifications.  Checking labs as below.  If we can get her back on her regular diet and activity level, we may benefit from increasing Mounjaro to 10 mg weekly and reducing glipizide to 2.5 mg twice daily with the ultimate goal to be able to wean off the glipizide.  We will review this at her next visit. - POCT HgB A1C - CMP14+EGFR - Lipid panel  2. Essential hypertension Unfortunately,  her blood pressure has been elevated at home above goal as it is today.  Continue amlodipine 10 mg daily and hydrochlorothiazide 25 mg daily.  With bradycardia, concern for her beta-blocker dose being too high.  Reducing carvedilol to 12.5 mg twice daily.  Increasing losartan to 100 mg daily.  Continue to monitor blood pressure at home with a goal of 130/80 or less.  Follow low-sodium diet and work to get regular intentional exercise at least 3 times weekly. - carvedilol (COREG) 12.5 MG tablet; Take 1 tablet (12.5 mg total) by mouth 2 (two) times daily with a meal.  Dispense: 180 tablet; Refill: 1 - losartan (COZAAR) 100 MG tablet; Take 1 tablet (100 mg total) by mouth daily.  Dispense: 90 tablet; Refill: 1 - CBC - CMP14+EGFR - Lipid panel  3. Hypertriglyceridemia Checking labs as below. - CMP14+EGFR - Lipid panel  4. Anxiety with depression Symptoms fairly well-managed and patient uninterested in increasing her dose.  Continue fluoxetine 40 mg daily.   Return in about 2 weeks (around 05/26/2022) for nurse visit for BP check.  ___________________________________________ Clearnce Sorrel, DNP, APRN, FNP-BC [facility] and [place]

## 2022-05-14 ENCOUNTER — Other Ambulatory Visit: Payer: Self-pay

## 2022-05-15 ENCOUNTER — Other Ambulatory Visit: Payer: Self-pay | Admitting: Medical-Surgical

## 2022-05-18 ENCOUNTER — Other Ambulatory Visit: Payer: Self-pay

## 2022-05-18 MED ORDER — DAPAGLIFLOZIN PROPANEDIOL 10 MG PO TABS
10.0000 mg | ORAL_TABLET | Freq: Every day | ORAL | 0 refills | Status: DC
  Filled 2022-05-18: qty 90, 90d supply, fill #0

## 2022-05-18 MED ORDER — MELOXICAM 15 MG PO TABS
15.0000 mg | ORAL_TABLET | Freq: Every day | ORAL | 0 refills | Status: DC
  Filled 2022-05-18: qty 30, 30d supply, fill #0

## 2022-05-18 MED ORDER — FLUOXETINE HCL 40 MG PO CAPS
40.0000 mg | ORAL_CAPSULE | Freq: Every day | ORAL | 0 refills | Status: DC
  Filled 2022-05-18: qty 90, 90d supply, fill #0

## 2022-05-21 ENCOUNTER — Other Ambulatory Visit: Payer: Self-pay

## 2022-05-25 ENCOUNTER — Ambulatory Visit (INDEPENDENT_AMBULATORY_CARE_PROVIDER_SITE_OTHER): Payer: Commercial Managed Care - PPO | Admitting: Medical-Surgical

## 2022-05-25 VITALS — BP 120/53 | HR 51 | Ht 64.0 in | Wt 216.0 lb

## 2022-05-25 DIAGNOSIS — Z0130 Encounter for examination of blood pressure without abnormal findings: Secondary | ICD-10-CM | POA: Insufficient documentation

## 2022-05-25 NOTE — Progress Notes (Signed)
   Subjective:    Patient ID: Marguerite Olea, female    DOB: [birth year], 67 y.o.   MRN: [id number]  HPI Pt here for bp check, pt denies any blurred vision, headache, missed medication doses or problems with medication.    Review of Systems     Objective:   Physical Exam        Assessment & Plan:  Pts bp today was 120/53, pt advised to follow up in 3 months.

## 2022-05-25 NOTE — Progress Notes (Signed)
Agree with documentation as below.  ___________________________________________ Griffen Frayne L. Monette Omara, DNP, APRN, FNP-BC Primary Care and [facility]  

## 2022-06-11 ENCOUNTER — Other Ambulatory Visit: Payer: Self-pay | Admitting: Medical-Surgical

## 2022-06-11 ENCOUNTER — Other Ambulatory Visit: Payer: Self-pay

## 2022-06-12 ENCOUNTER — Other Ambulatory Visit: Payer: Self-pay

## 2022-06-12 MED ORDER — MELOXICAM 15 MG PO TABS
15.0000 mg | ORAL_TABLET | Freq: Every day | ORAL | 0 refills | Status: DC
  Filled 2022-06-12: qty 30, 30d supply, fill #0

## 2022-07-06 ENCOUNTER — Other Ambulatory Visit: Payer: Self-pay | Admitting: Medical-Surgical

## 2022-07-07 ENCOUNTER — Other Ambulatory Visit: Payer: Self-pay

## 2022-07-07 MED ORDER — AMLODIPINE BESYLATE 10 MG PO TABS
10.0000 mg | ORAL_TABLET | Freq: Every day | ORAL | 1 refills | Status: DC
  Filled 2022-07-07: qty 90, 90d supply, fill #0

## 2022-07-09 ENCOUNTER — Other Ambulatory Visit: Payer: Self-pay

## 2022-07-14 ENCOUNTER — Telehealth: Payer: Commercial Managed Care - PPO | Admitting: Physician Assistant

## 2022-07-14 ENCOUNTER — Other Ambulatory Visit: Payer: Self-pay

## 2022-07-14 DIAGNOSIS — H60392 Other infective otitis externa, left ear: Secondary | ICD-10-CM | POA: Diagnosis not present

## 2022-07-14 MED ORDER — CIPROFLOXACIN-DEXAMETHASONE 0.3-0.1 % OT SUSP
4.0000 [drp] | Freq: Two times a day (BID) | OTIC | 0 refills | Status: DC
  Filled 2022-07-14: qty 7.5, 19d supply, fill #0

## 2022-07-14 NOTE — Progress Notes (Signed)
Virtual Visit Consent   Alicia Pineda, you are scheduled for a virtual visit with a [facility] provider today. Just as with appointments in the office, your consent must be obtained to participate. Your consent will be active for this visit and any virtual visit you may have with one of our providers in the next 365 days. If you have a MyChart account, a copy of this consent can be sent to you electronically.  As this is a virtual visit, video technology does not allow for your provider to perform a traditional examination. This may limit your provider's ability to fully assess your condition. If your provider identifies any concerns that need to be evaluated in person or the need to arrange testing (such as labs, EKG, etc.), we will make arrangements to do so. Although advances in technology are sophisticated, we cannot ensure that it will always work on either your end or our end. If the connection with a video visit is poor, the visit may have to be switched to a telephone visit. With either a video or telephone visit, we are not always able to ensure that we have a secure connection.  By engaging in this virtual visit, you consent to the provision of healthcare and authorize for your insurance to be billed (if applicable) for the services provided during this visit. Depending on your insurance coverage, you may receive a charge related to this service.  I need to obtain your verbal consent now. Are you willing to proceed with your visit today? Alicia Pineda has provided verbal consent on 07/14/2022 for a virtual visit (video or telephone). Alicia Pineda  Date: 07/14/2022 4:23 PM  Virtual Visit via Video Note   I, Alicia Pineda, connected with  Alicia Pineda  ([id number], 10/21/1955) on 07/14/22 at  4:15 PM EDT by a video-enabled telemedicine application and verified that I am speaking with the correct person using two identifiers.  Location: Patient: Virtual Visit Location  Patient: Home Provider: Virtual Visit Location Provider: Home Office   I discussed the limitations of evaluation and management by telemedicine and the availability of in person appointments. The patient expressed understanding and agreed to proceed.    History of Present Illness: Alicia Pineda is a 67 y.o. who identifies as a female who was assigned female at birth, and is being seen today for otalgia.  HPI: Otalgia  There is pain in the left ear. This is a recurrent problem. The current episode started in the past 7 days. The problem occurs constantly. The problem has been gradually worsening. There has been no fever. Associated symptoms include ear discharge (clear to yellow) and headaches. Pertinent negatives include no coughing, diarrhea, hearing loss, neck pain, rhinorrhea, sore throat or vomiting. Associated symptoms comments: Chronic sinus issues. She has tried ear drops and acetaminophen for the symptoms. The treatment provided no relief.     Problems:  Patient Active Problem List   Diagnosis Date Noted   Blood pressure check 05/25/2022   Type 2 diabetes mellitus with hyperglycemia, without long-term current use of insulin 03/05/2021   Essential hypertension 03/05/2021   Hypertriglyceridemia 03/05/2021    Allergies: No Known Allergies Medications:  Current Outpatient Medications:    ciprofloxacin-dexamethasone (CIPRODEX) OTIC suspension, Place 4 drops into the left ear 2 (two) times daily. X 7 days, Disp: 7.5 mL, Rfl: 0   amLODipine (NORVASC) 10 MG tablet, Take 1 tablet (10 mg total) by mouth daily., Disp: 90 tablet, Rfl: 1   CALCIUM PO, Take  1 tablet by mouth daily., Disp: , Rfl:    carvedilol (COREG) 12.5 MG tablet, Take 1 tablet (12.5 mg total) by mouth 2 (two) times daily with a meal., Disp: 180 tablet, Rfl: 1   dapagliflozin propanediol (FARXIGA) 10 MG TABS tablet, Take 1 tablet (10 mg total) by mouth daily., Disp: 90 tablet, Rfl: 0   esomeprazole (NEXIUM) 40 MG capsule,  Take 1 capsule (40 mg total) by mouth daily. 30 minutes before a meal, Disp: 30 capsule, Rfl: 3   FLUoxetine (PROZAC) 40 MG capsule, Take 1 capsule (40 mg total) by mouth daily., Disp: 90 capsule, Rfl: 0   glipiZIDE (GLUCOTROL) 5 MG tablet, Take 1 tablet (5 mg total) by mouth 2 (two) times daily., Disp: 180 tablet, Rfl: 1   hydrochlorothiazide (HYDRODIURIL) 25 MG tablet, Take 1 tablet (25 mg total) by mouth daily as needed., Disp: 90 tablet, Rfl: 1   losartan (COZAAR) 100 MG tablet, Take 1 tablet (100 mg total) by mouth daily., Disp: 90 tablet, Rfl: 1   meloxicam (MOBIC) 15 MG tablet, Take 1 tablet (15 mg total) by mouth daily., Disp: 30 tablet, Rfl: 0   tirzepatide (MOUNJARO) 7.5 MG/0.5ML Pen, Inject 7.5 mg into the skin once a week., Disp: 6 mL, Rfl: 0   Observations/Objective: Patient is well-developed, well-nourished in no acute distress.  Resting comfortably at home.  Head is normocephalic, atraumatic.  No labored breathing.  Speech is clear and coherent with logical content.  Patient is alert and oriented at baseline.    Assessment and Plan: 1. Other infective acute otitis externa of left ear - ciprofloxacin-dexamethasone (CIPRODEX) OTIC suspension; Place 4 drops into the left ear 2 (two) times daily. X 7 days  Dispense: 7.5 mL; Refill: 0  - Worsening symptoms that have not responded to OTC medications.  - Will give Ciprodex - Continue saline nasal rinses - Could consider to add Flonase (Fluticasone) nasal spray over the counter for possible eustachian tube dysfunction - Steam and humidifier can help - Warm compress to ear - Stay well hydrated and get plenty of rest.  - Seek in person evaluation if no symptom improvement or if symptoms worsen   Follow Up Instructions: I discussed the assessment and treatment plan with the patient. The patient was provided an opportunity to ask questions and all were answered. The patient agreed with the plan and demonstrated an understanding of  the instructions.  A copy of instructions were sent to the patient via MyChart unless otherwise noted below.    The patient was advised to call back or seek an in-person evaluation if the symptoms worsen or if the condition fails to improve as anticipated.  Time:  I spent 10 minutes with the patient via telehealth technology discussing the above problems/concerns.    Alicia Pineda

## 2022-07-14 NOTE — Patient Instructions (Signed)
Alicia Pineda, thank you for joining [name], PA-C for today's virtual visit.  While this provider is not your primary care provider (PCP), if your PCP is located in our provider database this encounter information will be shared with them immediately following your visit.   A [facility] MyChart account gives you access to today's visit and all your visits, tests, and labs performed at [facility] " click here if you don't have a [facility] MyChart account or go to [email or web address]  Consent: (Patient) Alicia Pineda provided verbal consent for this virtual visit at the beginning of the encounter.  Current Medications:  Current Outpatient Medications:    ciprofloxacin-dexamethasone (CIPRODEX) OTIC suspension, Place 4 drops into the left ear 2 (two) times daily for 7 days., Disp: 7.5 mL, Rfl: 0   amLODipine (NORVASC) 10 MG tablet, Take 1 tablet (10 mg total) by mouth daily., Disp: 90 tablet, Rfl: 1   CALCIUM PO, Take 1 tablet by mouth daily., Disp: , Rfl:    carvedilol (COREG) 12.5 MG tablet, Take 1 tablet (12.5 mg total) by mouth 2 (two) times daily with a meal., Disp: 180 tablet, Rfl: 1   dapagliflozin propanediol (FARXIGA) 10 MG TABS tablet, Take 1 tablet (10 mg total) by mouth daily., Disp: 90 tablet, Rfl: 0   esomeprazole (NEXIUM) 40 MG capsule, Take 1 capsule (40 mg total) by mouth daily. 30 minutes before a meal, Disp: 30 capsule, Rfl: 3   FLUoxetine (PROZAC) 40 MG capsule, Take 1 capsule (40 mg total) by mouth daily., Disp: 90 capsule, Rfl: 0   glipiZIDE (GLUCOTROL) 5 MG tablet, Take 1 tablet (5 mg total) by mouth 2 (two) times daily., Disp: 180 tablet, Rfl: 1   hydrochlorothiazide (HYDRODIURIL) 25 MG tablet, Take 1 tablet (25 mg total) by mouth daily as needed., Disp: 90 tablet, Rfl: 1   losartan (COZAAR) 100 MG tablet, Take 1 tablet (100 mg total) by mouth daily., Disp: 90 tablet, Rfl: 1   meloxicam (MOBIC) 15 MG tablet, Take 1 tablet (15 mg total) by  mouth daily., Disp: 30 tablet, Rfl: 0   tirzepatide (MOUNJARO) 7.5 MG/0.5ML Pen, Inject 7.5 mg into the skin once a week., Disp: 6 mL, Rfl: 0   Medications ordered in this encounter:  Meds ordered this encounter  Medications   ciprofloxacin-dexamethasone (CIPRODEX) OTIC suspension    Sig: Place 4 drops into the left ear 2 (two) times daily for 7 days.    Dispense:  7.5 mL    Refill:  0    Order Specific Question:   Supervising Provider    Answer:   Merrilee Jansky [[id number]]     *If you need refills on other medications prior to your next appointment, please contact your pharmacy*  Follow-Up: Call back or seek an in-person evaluation if the symptoms worsen or if the condition fails to improve as anticipated.   Virtual Care [phone]  Other Instructions  Otitis Externa  Otitis externa is an infection of the outer ear canal. The outer ear canal is the area between the outside of the ear and the eardrum. Otitis externa is sometimes called swimmer's ear. What are the causes? Common causes of this condition include: Swimming in dirty water. Moisture in the ear. An injury to the inside of the ear. An object stuck in the ear. A cut or scrape on the outside of the ear or in the ear canal. What increases the risk? You are more likely to develop this condition if  you go swimming often. What are the signs or symptoms? The first symptom of this condition is often itching in the ear. Later symptoms of the condition include: Swelling of the ear. Redness in the ear. Ear pain. The pain may get worse when you pull on your ear. Pus coming from the ear. How is this diagnosed? This condition may be diagnosed by examining the ear and testing fluid from the ear for bacteria and funguses. How is this treated? This condition may be treated with: Antibiotic ear drops. These are often given for 10-14 days. Medicines to reduce itching and swelling. Follow these instructions at  home: If you were prescribed antibiotic ear drops, use them as told by your health care provider. Do not stop using the antibiotic even if you start to feel better. Take over-the-counter and prescription medicines only as told by your health care provider. Avoid getting water in your ears as told by your health care provider. This may include avoiding swimming or water sports for a few days. Keep all follow-up visits. This is important. How is this prevented? Keep your ears dry. Use the corner of a towel to dry your ears after you swim or bathe. Avoid scratching or putting things in your ear. Doing these things can damage the ear canal or remove the protective wax that lines it, which makes it easier for bacteria and funguses to grow. Avoid swimming in lakes, polluted water, or swimming pools that may not have enough chlorine. Contact a health care provider if: You have a fever. Your ear is still red, swollen, painful, or draining pus after 3 days. Your redness, swelling, or pain gets worse. You have a severe headache. Get help right away if: You have redness, swelling, and pain or tenderness in the area behind your ear. Summary Otitis externa is an infection of the outer ear canal. Common causes include swimming in dirty water, moisture in the ear, or a cut or scrape in the ear. Symptoms include pain, redness, and swelling of the ear canal. If you were prescribed antibiotic ear drops, use them as told by your health care provider. Do not stop using the antibiotic even if you start to feel better. This information is not intended to replace advice given to you by your health care provider. Make sure you discuss any questions you have with your health care provider. Document Revised: 06/05/2020 Document Reviewed: 06/05/2020 Elsevier Patient Education  2023 Elsevier Inc.    If you have been instructed to have an in-person evaluation today at a local Urgent Care facility, please use the link  below. It will take you to a list of all of our available [facility], including address, phone number and hours of operation. Please do not delay care.  [facility]  If you or a family member do not have a primary care provider, use the link below to schedule a visit and establish care. When you choose a [facility] primary care physician or advanced practice provider, you gain a long-term partner in health. Find a Primary Care Provider  Learn more about [facility]'s in-office and virtual care options: [facility] - Get Care Now

## 2022-07-21 ENCOUNTER — Other Ambulatory Visit: Payer: Self-pay

## 2022-07-21 ENCOUNTER — Other Ambulatory Visit: Payer: Self-pay | Admitting: Medical-Surgical

## 2022-07-21 MED ORDER — MOUNJARO 7.5 MG/0.5ML ~~LOC~~ SOAJ
7.5000 mg | SUBCUTANEOUS | 0 refills | Status: DC
  Filled 2022-07-21 (×2): qty 2, 28d supply, fill #0

## 2022-07-21 MED ORDER — MELOXICAM 15 MG PO TABS
15.0000 mg | ORAL_TABLET | Freq: Every day | ORAL | 0 refills | Status: DC
  Filled 2022-07-21 – 2022-07-29 (×2): qty 30, 30d supply, fill #0

## 2022-07-23 ENCOUNTER — Other Ambulatory Visit: Payer: Self-pay

## 2022-07-27 ENCOUNTER — Other Ambulatory Visit: Payer: Self-pay

## 2022-07-28 ENCOUNTER — Other Ambulatory Visit: Payer: Self-pay

## 2022-07-29 ENCOUNTER — Other Ambulatory Visit: Payer: Self-pay

## 2022-08-10 ENCOUNTER — Other Ambulatory Visit: Payer: Self-pay | Admitting: Medical-Surgical

## 2022-08-10 ENCOUNTER — Other Ambulatory Visit: Payer: Self-pay

## 2022-08-11 ENCOUNTER — Other Ambulatory Visit: Payer: Self-pay

## 2022-08-12 ENCOUNTER — Other Ambulatory Visit: Payer: Self-pay | Admitting: Medical-Surgical

## 2022-08-12 ENCOUNTER — Other Ambulatory Visit: Payer: Self-pay

## 2022-08-12 MED ORDER — DAPAGLIFLOZIN PROPANEDIOL 10 MG PO TABS
10.0000 mg | ORAL_TABLET | Freq: Every day | ORAL | 0 refills | Status: DC
  Filled 2022-08-12: qty 90, 90d supply, fill #0

## 2022-08-13 ENCOUNTER — Other Ambulatory Visit: Payer: Self-pay

## 2022-08-13 MED ORDER — FLUOXETINE HCL 40 MG PO CAPS
40.0000 mg | ORAL_CAPSULE | Freq: Every day | ORAL | 0 refills | Status: DC
  Filled 2022-08-13: qty 90, 90d supply, fill #0

## 2022-08-14 ENCOUNTER — Other Ambulatory Visit: Payer: Self-pay

## 2022-08-24 ENCOUNTER — Encounter: Payer: Self-pay | Admitting: Medical-Surgical

## 2022-08-24 ENCOUNTER — Ambulatory Visit: Payer: Commercial Managed Care - PPO | Admitting: Medical-Surgical

## 2022-08-24 ENCOUNTER — Other Ambulatory Visit: Payer: Self-pay

## 2022-08-24 VITALS — BP 130/66 | HR 60 | Resp 20 | Ht 64.0 in | Wt 212.0 lb

## 2022-08-24 DIAGNOSIS — E781 Pure hyperglyceridemia: Secondary | ICD-10-CM | POA: Diagnosis not present

## 2022-08-24 DIAGNOSIS — E1165 Type 2 diabetes mellitus with hyperglycemia: Secondary | ICD-10-CM

## 2022-08-24 DIAGNOSIS — H6011 Cellulitis of right external ear: Secondary | ICD-10-CM | POA: Diagnosis not present

## 2022-08-24 DIAGNOSIS — Z7984 Long term (current) use of oral hypoglycemic drugs: Secondary | ICD-10-CM

## 2022-08-24 DIAGNOSIS — H539 Unspecified visual disturbance: Secondary | ICD-10-CM

## 2022-08-24 DIAGNOSIS — I10 Essential (primary) hypertension: Secondary | ICD-10-CM

## 2022-08-24 LAB — CBC WITH DIFFERENTIAL/PLATELET
HCT: 41.4 % (ref 35.0–45.0)
MCHC: 32.9 g/dL (ref 32.0–36.0)
MCV: 90 fL (ref 80.0–100.0)
MPV: 11.1 fL (ref 7.5–12.5)
Platelets: 257 10*3/uL (ref 140–400)
RBC: 4.6 10*6/uL (ref 3.80–5.10)

## 2022-08-24 LAB — POCT GLYCOSYLATED HEMOGLOBIN (HGB A1C): Hemoglobin A1C: 5.4 % (ref 4.0–5.6)

## 2022-08-24 MED ORDER — CIPROFLOXACIN-DEXAMETHASONE 0.3-0.1 % OT SUSP
4.0000 [drp] | Freq: Two times a day (BID) | OTIC | 0 refills | Status: AC
  Filled 2022-08-24: qty 7.5, 19d supply, fill #0

## 2022-08-24 MED ORDER — AMLODIPINE BESYLATE 10 MG PO TABS
10.0000 mg | ORAL_TABLET | Freq: Every day | ORAL | 1 refills | Status: DC
  Filled 2022-08-24 – 2022-10-05 (×2): qty 90, 90d supply, fill #0
  Filled 2022-12-31: qty 90, 90d supply, fill #1

## 2022-08-24 MED ORDER — GLIPIZIDE 5 MG PO TABS
5.0000 mg | ORAL_TABLET | Freq: Two times a day (BID) | ORAL | 1 refills | Status: DC
  Filled 2022-08-24 – 2022-10-29 (×2): qty 180, 90d supply, fill #0
  Filled 2023-01-25: qty 180, 90d supply, fill #1

## 2022-08-24 MED ORDER — ESOMEPRAZOLE MAGNESIUM 40 MG PO CPDR
40.0000 mg | DELAYED_RELEASE_CAPSULE | Freq: Every day | ORAL | 3 refills | Status: DC
  Filled 2022-08-24 – 2022-09-11 (×2): qty 30, 30d supply, fill #0
  Filled 2022-10-12: qty 30, 30d supply, fill #1
  Filled 2022-11-18: qty 30, 30d supply, fill #2
  Filled 2022-12-14: qty 30, 30d supply, fill #3

## 2022-08-24 MED ORDER — DAPAGLIFLOZIN PROPANEDIOL 10 MG PO TABS
10.0000 mg | ORAL_TABLET | Freq: Every day | ORAL | 0 refills | Status: DC
  Filled 2022-08-24 – 2022-11-12 (×2): qty 90, 90d supply, fill #0

## 2022-08-24 MED ORDER — LOSARTAN POTASSIUM 100 MG PO TABS
100.0000 mg | ORAL_TABLET | Freq: Every day | ORAL | 1 refills | Status: DC
Start: 2022-08-24 — End: 2023-03-10
  Filled 2022-08-24 – 2022-11-06 (×2): qty 90, 90d supply, fill #0
  Filled 2023-02-04: qty 90, 90d supply, fill #1

## 2022-08-24 MED ORDER — MOUNJARO 7.5 MG/0.5ML ~~LOC~~ SOAJ
7.5000 mg | SUBCUTANEOUS | 0 refills | Status: DC
  Filled 2022-08-24: qty 6, 84d supply, fill #0
  Filled 2022-08-25: qty 2, 28d supply, fill #0
  Filled 2022-09-22: qty 2, 28d supply, fill #1
  Filled 2022-10-14 – 2022-10-15 (×2): qty 2, 28d supply, fill #2

## 2022-08-24 MED ORDER — CARVEDILOL 12.5 MG PO TABS
12.5000 mg | ORAL_TABLET | Freq: Two times a day (BID) | ORAL | 1 refills | Status: DC
Start: 2022-08-24 — End: 2023-03-10
  Filled 2022-08-24 – 2022-11-06 (×2): qty 180, 90d supply, fill #0
  Filled 2023-02-04: qty 180, 90d supply, fill #1

## 2022-08-24 MED ORDER — CEPHALEXIN 500 MG PO CAPS
500.0000 mg | ORAL_CAPSULE | Freq: Three times a day (TID) | ORAL | 0 refills | Status: DC
  Filled 2022-08-24: qty 21, 7d supply, fill #0

## 2022-08-24 MED ORDER — MELOXICAM 15 MG PO TABS
15.0000 mg | ORAL_TABLET | Freq: Every day | ORAL | 0 refills | Status: DC
  Filled 2022-08-24 (×2): qty 30, 30d supply, fill #0

## 2022-08-24 MED ORDER — FLUOXETINE HCL 40 MG PO CAPS
40.0000 mg | ORAL_CAPSULE | Freq: Every day | ORAL | 0 refills | Status: DC
  Filled 2022-08-24 – 2022-11-12 (×2): qty 90, 90d supply, fill #0

## 2022-08-24 NOTE — Progress Notes (Signed)
Established patient visit  History, exam, impression, and plan:  1. Essential hypertension Pleasant 67 year old female presenting today with a history of hypertension currently managed with amlodipine 10 mg daily, Coreg 12.5 mg twice daily, and losartan 100 mg daily.  Tolerating all medications well without side effects.  Has hydrochlorothiazide 25 mg daily but does not take this regularly.  Monitoring blood pressures at home with readings at goal.  Following a low-sodium diet.  Activity as tolerated.  Denies CP, SOB, LE edema, HA, dizziness, and syncopal episodes.  Has had some recent vision changes (see below).  HRR, S1/S2 normal.  Lungs CTA, respirations even unlabored.  No peripheral edema.  Updating labs today.  Continue medications as prescribed. - carvedilol (COREG) 12.5 MG tablet; Take 1 tablet (12.5 mg total) by mouth 2 (two) times daily with a meal.  Dispense: 180 tablet; Refill: 1 - losartan (COZAAR) 100 MG tablet; Take 1 tablet (100 mg total) by mouth daily.  Dispense: 90 tablet; Refill: 1 - CBC with Differential/Platelet - COMPLETE METABOLIC PANEL WITH GFR - Lipid panel  2. Vision changes Previously seen by an eye doctor at [facility] and has an appointment coming up but not until July.  Over the last month and a half, has had what seemed to be a floater however this has been stationary and is half-moon shaped, affecting the right eye only.  Interested in being seen earlier if possible.  Referring to ophthalmology per patient request. - Ambulatory referral to Ophthalmology  3. Type 2 diabetes mellitus with hyperglycemia, without long-term current use of insulin (HCC) Has been taking Mounjaro 7.5 mg daily, glipizide 5 mg twice daily, and Farxiga 10 mg daily.  Tolerating all medications well without side effects.  Monitoring sugars at home but notes that she has been less than consistent with a diabetic diet.  Unsure if she wants to increase the [facility] or keep it where it is  for now.  POCT hemoglobin A1c checked a little over 3 months ago at 5.9% showing excellent control.  Recheck today for peace of mind showing A1c at 5.4%.  Checking labs as below.  Recommend working on diabetic diet compliance.  Continue checking sugars at home with fasting goals of 120 or less.  Continue Mounjaro, Farxiga, and glipizide as prescribed. - COMPLETE METABOLIC PANEL WITH GFR - Lipid panel - POCT HgB A1C  4. Hypertriglyceridemia History of hyperlipidemia currently not on any medications.  Checking labs today. - COMPLETE METABOLIC PANEL WITH GFR - Lipid panel  5. Cellulitis of right ear History of frequent ear infections and just had one of the left ear about a month ago.  Now she is having symptoms on the right.  Reports that the ear canal feels swollen and there is significant tragal tenderness as well as tenderness around the posterior of the auricle.  Has not had any drainage out of the ear and denies fever/chills.  On exam, left ear canal and TM normal.  Right TM looks good however the external ear canal is erythematous and swollen.  Tenderness to palpation all around the ear and to manipulation of the tragus and auricle.  Symptoms and presentation consistent with cellulitis.  Treating with Ciprodex external eardrops twice daily x 7 days as well as Keflex 3 times daily x 7 days.  Procedures performed this visit: None.  Return in about 6 months (around 02/24/2023) for chronic disease follow up.  __________________________________ Thayer Ohm, DNP, APRN, FNP-BC Primary Care  and Sports Medicine [facility] [place]

## 2022-08-25 ENCOUNTER — Other Ambulatory Visit: Payer: Self-pay | Admitting: Medical-Surgical

## 2022-08-25 LAB — CBC WITH DIFFERENTIAL/PLATELET
Absolute Monocytes: 475 cells/uL (ref 200–950)
Basophils Absolute: 33 cells/uL (ref 0–200)
Basophils Relative: 0.5 %
Eosinophils Absolute: 231 cells/uL (ref 15–500)
Eosinophils Relative: 3.5 %
Hemoglobin: 13.6 g/dL (ref 11.7–15.5)
Lymphs Abs: 1874 cells/uL (ref 850–3900)
MCH: 29.6 pg (ref 27.0–33.0)
Monocytes Relative: 7.2 %
Neutro Abs: 3986 cells/uL (ref 1500–7800)
Neutrophils Relative %: 60.4 %
RDW: 13.4 % (ref 11.0–15.0)
Total Lymphocyte: 28.4 %
WBC: 6.6 10*3/uL (ref 3.8–10.8)

## 2022-08-25 LAB — COMPLETE METABOLIC PANEL WITH GFR
AG Ratio: 1.5 (calc) (ref 1.0–2.5)
ALT: 16 U/L (ref 6–29)
AST: 14 U/L (ref 10–35)
Albumin: 4 g/dL (ref 3.6–5.1)
Alkaline phosphatase (APISO): 145 U/L (ref 37–153)
BUN/Creatinine Ratio: 45 (calc) — ABNORMAL HIGH (ref 6–22)
BUN: 29 mg/dL — ABNORMAL HIGH (ref 7–25)
CO2: 26 mmol/L (ref 20–32)
Calcium: 9.2 mg/dL (ref 8.6–10.4)
Chloride: 107 mmol/L (ref 98–110)
Creat: 0.64 mg/dL (ref 0.50–1.05)
Globulin: 2.6 g/dL (calc) (ref 1.9–3.7)
Glucose, Bld: 88 mg/dL (ref 65–99)
Potassium: 4.6 mmol/L (ref 3.5–5.3)
Sodium: 142 mmol/L (ref 135–146)
Total Bilirubin: 0.4 mg/dL (ref 0.2–1.2)
Total Protein: 6.6 g/dL (ref 6.1–8.1)
eGFR: 99 mL/min/{1.73_m2} (ref >=60)

## 2022-08-25 LAB — LIPID PANEL
Cholesterol: 128 mg/dL (ref <200)
HDL: 50 mg/dL (ref >=50)
LDL Cholesterol (Calc): 55 mg/dL (calc)
Non-HDL Cholesterol (Calc): 78 mg/dL (calc) (ref <130)
Total CHOL/HDL Ratio: 2.6 (calc) (ref <5.0)
Triglycerides: 144 mg/dL (ref <150)

## 2022-08-26 ENCOUNTER — Other Ambulatory Visit: Payer: Self-pay

## 2022-08-27 ENCOUNTER — Other Ambulatory Visit: Payer: Self-pay

## 2022-09-02 LAB — HM DIABETES EYE EXAM

## 2022-09-04 DIAGNOSIS — H524 Presbyopia: Secondary | ICD-10-CM | POA: Diagnosis not present

## 2022-09-04 DIAGNOSIS — E119 Type 2 diabetes mellitus without complications: Secondary | ICD-10-CM | POA: Diagnosis not present

## 2022-09-12 ENCOUNTER — Other Ambulatory Visit: Payer: Self-pay

## 2022-09-14 ENCOUNTER — Other Ambulatory Visit: Payer: Self-pay

## 2022-09-16 ENCOUNTER — Other Ambulatory Visit: Payer: Self-pay

## 2022-09-18 ENCOUNTER — Encounter: Payer: Self-pay | Admitting: Medical-Surgical

## 2022-09-22 ENCOUNTER — Other Ambulatory Visit: Payer: Self-pay

## 2022-09-22 ENCOUNTER — Other Ambulatory Visit: Payer: Self-pay | Admitting: Medical-Surgical

## 2022-09-22 MED ORDER — MELOXICAM 15 MG PO TABS
15.0000 mg | ORAL_TABLET | Freq: Every day | ORAL | 0 refills | Status: DC
  Filled 2022-09-22: qty 30, 30d supply, fill #0

## 2022-09-24 ENCOUNTER — Other Ambulatory Visit: Payer: Self-pay

## 2022-10-05 ENCOUNTER — Other Ambulatory Visit: Payer: Self-pay

## 2022-10-06 ENCOUNTER — Other Ambulatory Visit: Payer: Self-pay

## 2022-10-14 ENCOUNTER — Other Ambulatory Visit: Payer: Self-pay

## 2022-10-14 ENCOUNTER — Other Ambulatory Visit: Payer: Self-pay | Admitting: Oncology

## 2022-10-14 DIAGNOSIS — Z006 Encounter for examination for normal comparison and control in clinical research program: Secondary | ICD-10-CM

## 2022-10-15 ENCOUNTER — Other Ambulatory Visit: Payer: Self-pay

## 2022-10-19 ENCOUNTER — Other Ambulatory Visit: Payer: Self-pay

## 2022-10-20 ENCOUNTER — Other Ambulatory Visit (HOSPITAL_COMMUNITY)
Admission: AD | Admit: 2022-10-20 | Discharge: 2022-10-20 | Disposition: A | Discharge status: Home / Self Care | Payer: Commercial Managed Care - PPO | Source: Ambulatory Visit | Attending: Oncology | Admitting: Oncology

## 2022-10-20 DIAGNOSIS — Z006 Encounter for examination for normal comparison and control in clinical research program: Secondary | ICD-10-CM | POA: Insufficient documentation

## 2022-10-28 ENCOUNTER — Other Ambulatory Visit: Payer: Self-pay | Admitting: Medical-Surgical

## 2022-10-29 ENCOUNTER — Other Ambulatory Visit: Payer: Self-pay

## 2022-10-29 MED ORDER — MELOXICAM 15 MG PO TABS
15.0000 mg | ORAL_TABLET | Freq: Every day | ORAL | 0 refills | Status: DC
  Filled 2022-10-29: qty 30, 30d supply, fill #0

## 2022-10-30 ENCOUNTER — Other Ambulatory Visit: Payer: Self-pay

## 2022-11-06 ENCOUNTER — Other Ambulatory Visit (HOSPITAL_COMMUNITY): Payer: Self-pay

## 2022-11-07 ENCOUNTER — Other Ambulatory Visit (HOSPITAL_COMMUNITY): Payer: Self-pay

## 2022-11-12 ENCOUNTER — Other Ambulatory Visit: Payer: Self-pay

## 2022-11-12 ENCOUNTER — Other Ambulatory Visit: Payer: Self-pay | Admitting: Medical-Surgical

## 2022-11-12 MED ORDER — MOUNJARO 7.5 MG/0.5ML ~~LOC~~ SOAJ
7.5000 mg | SUBCUTANEOUS | 0 refills | Status: DC
  Filled 2022-11-12: qty 2, 28d supply, fill #0
  Filled 2022-12-13: qty 2, 28d supply, fill #1
  Filled 2023-01-08 – 2023-01-15 (×2): qty 2, 28d supply, fill #2

## 2022-11-16 ENCOUNTER — Other Ambulatory Visit: Payer: Self-pay

## 2022-11-16 DIAGNOSIS — H26493 Other secondary cataract, bilateral: Secondary | ICD-10-CM | POA: Diagnosis not present

## 2022-11-17 ENCOUNTER — Other Ambulatory Visit: Payer: Self-pay

## 2022-11-18 ENCOUNTER — Other Ambulatory Visit (HOSPITAL_COMMUNITY): Payer: Self-pay

## 2022-11-18 ENCOUNTER — Other Ambulatory Visit: Payer: Self-pay

## 2022-11-18 ENCOUNTER — Other Ambulatory Visit: Payer: Self-pay | Admitting: Medical-Surgical

## 2022-11-18 ENCOUNTER — Encounter (HOSPITAL_COMMUNITY): Payer: Self-pay

## 2022-11-18 MED ORDER — MELOXICAM 15 MG PO TABS
15.0000 mg | ORAL_TABLET | Freq: Every day | ORAL | 0 refills | Status: DC
  Filled 2022-11-18: qty 30, 30d supply, fill #0

## 2022-11-19 ENCOUNTER — Other Ambulatory Visit (HOSPITAL_COMMUNITY): Payer: Self-pay

## 2022-11-19 ENCOUNTER — Other Ambulatory Visit: Payer: Self-pay

## 2022-11-30 ENCOUNTER — Other Ambulatory Visit (HOSPITAL_COMMUNITY): Payer: Self-pay

## 2022-11-30 ENCOUNTER — Other Ambulatory Visit: Payer: Self-pay

## 2022-11-30 DIAGNOSIS — H26493 Other secondary cataract, bilateral: Secondary | ICD-10-CM | POA: Diagnosis not present

## 2022-11-30 MED ORDER — PREDNISOLONE ACETATE 1 % OP SUSP
1.0000 [drp] | Freq: Four times a day (QID) | OPHTHALMIC | 0 refills | Status: DC
  Filled 2022-11-30: qty 5, 7d supply, fill #0

## 2022-12-14 ENCOUNTER — Other Ambulatory Visit: Payer: Self-pay

## 2022-12-16 ENCOUNTER — Other Ambulatory Visit: Payer: Self-pay

## 2022-12-28 ENCOUNTER — Other Ambulatory Visit: Payer: Self-pay | Admitting: Medical-Surgical

## 2022-12-31 ENCOUNTER — Other Ambulatory Visit (HOSPITAL_COMMUNITY): Payer: Self-pay

## 2022-12-31 MED ORDER — MELOXICAM 15 MG PO TABS
15.0000 mg | ORAL_TABLET | Freq: Every day | ORAL | 0 refills | Status: DC
  Filled 2022-12-31: qty 30, 30d supply, fill #0

## 2023-01-10 ENCOUNTER — Ambulatory Visit
Admission: EM | Admit: 2023-01-10 | Discharge: 2023-01-10 | Disposition: A | Discharge status: Home / Self Care | Payer: Commercial Managed Care - PPO | Attending: Family Medicine | Admitting: Family Medicine

## 2023-01-10 ENCOUNTER — Other Ambulatory Visit: Payer: Self-pay

## 2023-01-10 DIAGNOSIS — H6692 Otitis media, unspecified, left ear: Secondary | ICD-10-CM | POA: Diagnosis not present

## 2023-01-10 DIAGNOSIS — H9202 Otalgia, left ear: Secondary | ICD-10-CM | POA: Diagnosis not present

## 2023-01-10 MED ORDER — AMOXICILLIN-POT CLAVULANATE 875-125 MG PO TABS: 1.0000 | ORAL_TABLET | Freq: Two times a day (BID) | ORAL | 0 refills | Status: AC

## 2023-01-10 NOTE — Discharge Instructions (Addendum)
Advised patient to take medication as directed with food to completion.  Encouraged to increase daily water intake to 64 ounces per day while taking this medication.  Advised patient not to submerge head underwater for the next 10 to 14 days.  Advised if symptoms worsen and/or unresolved please follow-up PCP or here for further evaluation.

## 2023-01-10 NOTE — ED Triage Notes (Signed)
Pt states over the past weeks she has been having discomfort to left ear/ side of face.   Home interventions: ciprofloxacin ear drops

## 2023-01-10 NOTE — ED Provider Notes (Signed)
[facility]    CSN: [id number] Arrival date & time: 01/10/23  1157      History   Chief Complaint Chief Complaint  Patient presents with   Ear Pain    HPI Alicia Pineda is a 67 y.o. female.   HPI Pleasant 67 year old female presents with left ear left side of face discomfort for 3 weeks.  Reports using ciprofloxacin eardrops from previous prescription. PMH significant for T2DM, HTN, and hypertriglyceridemia.  Past Medical History:  Diagnosis Date   COVID-19 04/2020   also had in 2020   Diabetes mellitus without complication [facility])    Hypertension     Patient Active Problem List   Diagnosis Date Noted   Blood pressure check 05/25/2022   Type 2 diabetes mellitus with hyperglycemia, without long-term current use of insulin (HCC) 03/05/2021   Essential hypertension 03/05/2021   Hypertriglyceridemia 03/05/2021   Nuclear sclerosis of both eyes 10/17/2020   Cholecystitis 11/09/2017    Past Surgical History:  Procedure Laterality Date   ABDOMINAL HYSTERECTOMY     2007   CATARACT EXTRACTION     CHOLECYSTECTOMY     TUBAL LIGATION      OB History     Gravida  3   Para  3   Term  3   Preterm      AB      Living         SAB      IAB      Ectopic      Multiple      Live Births               Home Medications    Prior to Admission medications   Medication Sig Start Date End Date Taking? Authorizing Provider  amoxicillin-clavulanate (AUGMENTIN) 875-125 MG tablet Take 1 tablet by mouth 2 (two) times daily for 10 days. 01/10/23 01/20/23 Yes Trevor Iha, FNP  amLODipine (NORVASC) 10 MG tablet Take 1 tablet (10 mg total) by mouth daily. 08/24/22   Christen Butter, NP  CALCIUM PO Take 1 tablet by mouth daily.    [provider]  carvedilol (COREG) 12.5 MG tablet Take 1 tablet (12.5 mg total) by mouth 2 (two) times daily with a meal. 08/24/22   Christen Butter, NP  dapagliflozin propanediol (FARXIGA) 10 MG TABS tablet Take 1 tablet (10 mg  total) by mouth daily. 08/24/22   Christen Butter, NP  esomeprazole (NEXIUM) 40 MG capsule Take 1 capsule (40 mg total) by mouth daily. 30 minutes before a meal 08/24/22   Christen Butter, NP  FLUoxetine (PROZAC) 40 MG capsule Take 1 capsule (40 mg total) by mouth daily. 08/24/22   Christen Butter, NP  glipiZIDE (GLUCOTROL) 5 MG tablet Take 1 tablet (5 mg total) by mouth 2 (two) times daily. 08/24/22   Christen Butter, NP  hydrochlorothiazide (HYDRODIURIL) 25 MG tablet Take 1 tablet (25 mg total) by mouth daily as needed. 03/05/21   Christen Butter, NP  losartan (COZAAR) 100 MG tablet Take 1 tablet (100 mg total) by mouth daily. 08/24/22   Christen Butter, NP  meloxicam (MOBIC) 15 MG tablet Take 1 tablet (15 mg total) by mouth daily. 12/31/22   Christen Butter, NP  prednisoLONE acetate (PRED FORTE) 1 % ophthalmic suspension Place 1 drop into affected eye 4 (four) times daily for 5 days then stop 11/30/22     tirzepatide (MOUNJARO) 7.5 MG/0.5ML Pen Inject 7.5 mg into the skin once a week. 11/12/22   Christen Butter, NP  Family History Family History  Problem Relation Age of Onset   Hypertension Mother    Heart failure Mother    Diabetes Mother    Skin cancer Mother    Hypertension Father    Heart failure Father    Diabetes Father    Prostate cancer Father    Diabetes Sister    Heart failure Brother    Hypertension Maternal Grandfather    Heart attack Maternal Grandfather    Stroke Maternal Grandfather    Colon cancer Neg Hx    Stomach cancer Neg Hx    Esophageal cancer Neg Hx    Pancreatic cancer Neg Hx     Social History Social History   Tobacco Use   Smoking status: Never   Smokeless tobacco: Never  Substance Use Topics   Alcohol use: Not Currently   Drug use: Never     Allergies   Patient has no known allergies.   Review of Systems Review of Systems  HENT:  Positive for congestion, ear pain and sinus pain.   All other systems reviewed and are negative.    Physical Exam Triage Vital Signs ED  Triage Vitals  Encounter Vitals Group     BP      Systolic BP Percentile      Diastolic BP Percentile      Pulse      Resp      Temp      Temp src      SpO2      Weight      Height      Head Circumference      Peak Flow      Pain Score      Pain Loc      Pain Education      Exclude from Growth Chart    No data found.  Updated Vital Signs BP (!) 147/72 (BP Location: Left Arm)   Pulse (!) 57   Temp 98.2 F (36.8 C) (Oral)   Resp 18   SpO2 97%    Physical Exam Vitals and nursing note reviewed.  Constitutional:      Appearance: Normal appearance. She is obese.  HENT:     Head: Normocephalic and atraumatic.     Right Ear: Tympanic membrane and external ear normal.     Left Ear: External ear normal.     Ears:     Comments: Left TM: Red rimmed, erythematous, bulging    Mouth/Throat:     Mouth: Mucous membranes are moist.     Pharynx: Oropharynx is clear.  Eyes:     Extraocular Movements: Extraocular movements intact.     Conjunctiva/sclera: Conjunctivae normal.     Pupils: Pupils are equal, round, and reactive to light.  Cardiovascular:     Rate and Rhythm: Normal rate and regular rhythm.     Pulses: Normal pulses.     Heart sounds: Normal heart sounds.  Pulmonary:     Effort: Pulmonary effort is normal.     Breath sounds: Normal breath sounds. No wheezing, rhonchi or rales.  Musculoskeletal:        General: Normal range of motion.     Cervical back: Normal range of motion and neck supple.  Skin:    General: Skin is warm and dry.  Neurological:     General: No focal deficit present.     Mental Status: She is alert and oriented to person, place, and time.  Psychiatric:  Mood and Affect: Mood normal.        Behavior: Behavior normal.      UC Treatments / Results  Labs (all labs ordered are listed, but only abnormal results are displayed) Labs Reviewed - No data to display  EKG   Radiology No results found.  Procedures Procedures (including  critical care time)  Medications Ordered in UC Medications - No data to display  Initial Impression / Assessment and Plan / UC Course  I have reviewed the triage vital signs and the nursing notes.  Pertinent labs & imaging results that were available during my care of the patient were reviewed by me and considered in my medical decision making (see chart for details).     MDM: 1.  Acute left otitis media-Rx'd Augmentin 875/125 mg tablet: Take 1 tablet twice daily x 10 days; 2. Acute otalgia, left-Rx'd Augmentin 875/125 mg tablet: Take 1 tablet twice daily x 10 days. Advised patient to take medication as directed with food to completion.  Encouraged to increase daily water intake to 64 ounces per day while taking this medication.  Advised patient not to submerge head underwater for the next 10 to 14 days.  Advised if symptoms worsen and/or unresolved please follow-up PCP or here for further evaluation.  Patient discharged home, hemodynamically stable. Final Clinical Impressions(s) / UC Diagnoses   Final diagnoses:  Acute left otitis media  Acute otalgia, left     Discharge Instructions      Advised patient to take medication as directed with food to completion.  Encouraged to increase daily water intake to 64 ounces per day while taking this medication.  Advised patient not to submerge head underwater for the next 10 to 14 days.  Advised if symptoms worsen and/or unresolved please follow-up PCP or here for further evaluation.     ED Prescriptions     Medication Sig Dispense Auth. Provider   amoxicillin-clavulanate (AUGMENTIN) 875-125 MG tablet Take 1 tablet by mouth 2 (two) times daily for 10 days. 20 tablet Trevor Iha, FNP      PDMP not reviewed this encounter.   Trevor Iha, FNP 01/10/23 1249

## 2023-01-12 ENCOUNTER — Other Ambulatory Visit: Payer: Self-pay | Admitting: Medical-Surgical

## 2023-01-13 ENCOUNTER — Other Ambulatory Visit: Payer: Self-pay

## 2023-01-13 ENCOUNTER — Other Ambulatory Visit (HOSPITAL_COMMUNITY): Payer: Self-pay

## 2023-01-13 MED ORDER — ESOMEPRAZOLE MAGNESIUM 40 MG PO CPDR
40.0000 mg | DELAYED_RELEASE_CAPSULE | Freq: Every day | ORAL | 0 refills | Status: DC
  Filled 2023-01-13: qty 30, 30d supply, fill #0

## 2023-01-15 ENCOUNTER — Other Ambulatory Visit: Payer: Self-pay

## 2023-01-15 ENCOUNTER — Other Ambulatory Visit (HOSPITAL_COMMUNITY): Payer: Self-pay

## 2023-01-15 ENCOUNTER — Encounter: Payer: Self-pay | Admitting: Medical-Surgical

## 2023-01-15 MED ORDER — ONETOUCH VERIO VI STRP
ORAL_STRIP | 99 refills | Status: AC
  Filled 2023-01-15: qty 100, 33d supply, fill #0
  Filled 2023-05-19: qty 100, 33d supply, fill #1
  Filled 2023-07-22: qty 100, 33d supply, fill #2
  Filled 2023-08-23: qty 100, 33d supply, fill #3

## 2023-01-15 MED ORDER — ONETOUCH VERIO FLEX SYSTEM W/DEVICE KIT
PACK | 0 refills | Status: AC
  Filled 2023-01-15: qty 1, 30d supply, fill #0

## 2023-01-15 MED ORDER — LANCETS 30G MISC
99 refills | Status: AC
  Filled 2023-01-15: qty 100, 33d supply, fill #0

## 2023-01-18 ENCOUNTER — Other Ambulatory Visit: Payer: Self-pay

## 2023-01-26 ENCOUNTER — Other Ambulatory Visit: Payer: Self-pay

## 2023-01-27 ENCOUNTER — Encounter: Payer: Self-pay | Admitting: Medical-Surgical

## 2023-01-28 ENCOUNTER — Other Ambulatory Visit: Payer: Self-pay

## 2023-01-28 NOTE — Telephone Encounter (Signed)
Attempted call to patient. Left a voice mail message requesting a return call.  

## 2023-01-29 ENCOUNTER — Ambulatory Visit (INDEPENDENT_AMBULATORY_CARE_PROVIDER_SITE_OTHER): Payer: Commercial Managed Care - PPO | Admitting: Medical-Surgical

## 2023-01-29 ENCOUNTER — Other Ambulatory Visit: Payer: Self-pay

## 2023-01-29 VITALS — BP 134/59 | HR 51 | Temp 97.7°F | Ht 64.0 in | Wt 214.1 lb

## 2023-01-29 DIAGNOSIS — R300 Dysuria: Secondary | ICD-10-CM

## 2023-01-29 DIAGNOSIS — N3001 Acute cystitis with hematuria: Secondary | ICD-10-CM

## 2023-01-29 LAB — POCT URINALYSIS DIP (CLINITEK)
Bilirubin, UA: NEGATIVE
Glucose, UA: 500 mg/dL — AB
Ketones, POC UA: NEGATIVE mg/dL
Nitrite, UA: POSITIVE — AB
POC PROTEIN,UA: 100 — AB
Spec Grav, UA: 1.025 (ref 1.010–1.025)
Urobilinogen, UA: 1 U/dL
pH, UA: 6 (ref 5.0–8.0)

## 2023-01-29 MED ORDER — NITROFURANTOIN MONOHYD MACRO 100 MG PO CAPS
100.0000 mg | ORAL_CAPSULE | Freq: Two times a day (BID) | ORAL | 0 refills | Status: DC
  Filled 2023-01-29: qty 10, 5d supply, fill #0

## 2023-01-29 MED ORDER — PHENAZOPYRIDINE HCL 100 MG PO TABS
100.0000 mg | ORAL_TABLET | Freq: Three times a day (TID) | ORAL | 0 refills | Status: DC | PRN
  Filled 2023-01-29: qty 15, 5d supply, fill #0

## 2023-01-29 NOTE — Progress Notes (Signed)
Patient is here for dysuria. Patient reports no recent antibiotic use or recent catheterization. Patient has not taken Azo. Denies  fever, chills or sweats. Currently having flank pain, urgency, and frequency.

## 2023-02-02 NOTE — Telephone Encounter (Signed)
Spoke with patient was started on abx and symptoms are improving . Awaiting urine culture results.

## 2023-02-04 ENCOUNTER — Other Ambulatory Visit (HOSPITAL_COMMUNITY): Payer: Self-pay

## 2023-02-04 ENCOUNTER — Other Ambulatory Visit: Payer: Self-pay | Admitting: Medical-Surgical

## 2023-02-04 ENCOUNTER — Other Ambulatory Visit: Payer: Self-pay

## 2023-02-04 LAB — URINE CULTURE

## 2023-02-04 MED ORDER — MOUNJARO 7.5 MG/0.5ML ~~LOC~~ SOAJ
7.5000 mg | SUBCUTANEOUS | 0 refills | Status: DC
  Filled 2023-02-04: qty 6, 84d supply, fill #0
  Filled 2023-02-08: qty 2, 28d supply, fill #0
  Filled 2023-03-08: qty 2, 28d supply, fill #1
  Filled 2023-04-08 – 2023-04-13 (×3): qty 2, 28d supply, fill #2

## 2023-02-04 MED ORDER — MELOXICAM 15 MG PO TABS
15.0000 mg | ORAL_TABLET | Freq: Every day | ORAL | 0 refills | Status: DC
  Filled 2023-02-04 (×2): qty 30, 30d supply, fill #0

## 2023-02-04 NOTE — Progress Notes (Signed)
Can we please check on what is taking so long with the urine culture.  We have a preliminary report but have not received a final result with susceptibilities.

## 2023-02-05 ENCOUNTER — Telehealth: Payer: Commercial Managed Care - PPO | Admitting: Physician Assistant

## 2023-02-05 DIAGNOSIS — R3989 Other symptoms and signs involving the genitourinary system: Secondary | ICD-10-CM | POA: Diagnosis not present

## 2023-02-05 MED ORDER — CEPHALEXIN 500 MG PO CAPS: 500.0000 mg | ORAL_CAPSULE | Freq: Two times a day (BID) | ORAL | 0 refills | Status: DC

## 2023-02-05 NOTE — Progress Notes (Signed)
E-Visit for Urinary Problems  We are sorry that you are not feeling well.  Here is how we plan to help!  Based on what you shared with me it looks like you most likely have a simple urinary tract infection.  A UTI (Urinary Tract Infection) is a bacterial infection of the bladder.  Most cases of urinary tract infections are simple to treat but a key part of your care is to encourage you to drink plenty of fluids and watch your symptoms carefully.  I have prescribed Keflex 500 mg twice a day for 7 days.  Your symptoms should gradually improve. Call us if the burning in your urine worsens, you develop worsening fever, back pain or pelvic pain or if your symptoms do not resolve after completing the antibiotic.  Urinary tract infections can be prevented by drinking plenty of water to keep your body hydrated.  Also be sure when you wipe, wipe from front to back and don't hold it in!  If possible, empty your bladder every 4 hours.  HOME CARE Drink plenty of fluids Compete the full course of the antibiotics even if the symptoms resolve Remember, when you need to go.go. Holding in your urine can increase the likelihood of getting a UTI! GET HELP RIGHT AWAY IF: You cannot urinate You get a high fever Worsening back pain occurs You see blood in your urine You feel sick to your stomach or throw up You feel like you are going to pass out  MAKE SURE YOU  Understand these instructions. Will watch your condition. Will get help right away if you are not doing well or get worse.   Thank you for choosing an e-visit.  Your e-visit answers were reviewed by a board certified advanced clinical practitioner to complete your personal care plan. Depending upon the condition, your plan could have included both over the counter or prescription medications.  Please review your pharmacy choice. Make sure the pharmacy is open so you can pick up prescription now. If there is a problem, you may contact your  provider through MyChart messaging and have the prescription routed to another pharmacy.  Your safety is important to us. If you have drug allergies check your prescription carefully.   For the next 24 hours you can use MyChart to ask questions about today's visit, request a non-urgent call back, or ask for a work or school excuse. You will get an email in the next two days asking about your experience. I hope that your e-visit has been valuable and will speed your recovery.  I have spent 5 minutes in review of e-visit questionnaire, review and updating patient chart, medical decision making and response to patient.   Shiheem Corporan M Miliana Gangwer, PA-C  

## 2023-02-08 ENCOUNTER — Encounter (HOSPITAL_COMMUNITY): Payer: Self-pay

## 2023-02-08 ENCOUNTER — Other Ambulatory Visit: Payer: Self-pay | Admitting: Medical-Surgical

## 2023-02-08 ENCOUNTER — Other Ambulatory Visit: Payer: Self-pay

## 2023-02-09 ENCOUNTER — Other Ambulatory Visit: Payer: Self-pay

## 2023-02-09 ENCOUNTER — Other Ambulatory Visit (HOSPITAL_COMMUNITY): Payer: Self-pay

## 2023-02-09 MED ORDER — FLUOXETINE HCL 40 MG PO CAPS
40.0000 mg | ORAL_CAPSULE | Freq: Every day | ORAL | 0 refills | Status: DC
  Filled 2023-02-09 – 2023-02-11 (×2): qty 90, 90d supply, fill #0

## 2023-02-11 ENCOUNTER — Other Ambulatory Visit (HOSPITAL_COMMUNITY): Payer: Self-pay

## 2023-02-11 ENCOUNTER — Other Ambulatory Visit: Payer: Self-pay

## 2023-02-11 ENCOUNTER — Other Ambulatory Visit: Payer: Self-pay | Admitting: Medical-Surgical

## 2023-02-12 ENCOUNTER — Other Ambulatory Visit (HOSPITAL_COMMUNITY): Payer: Self-pay

## 2023-02-12 ENCOUNTER — Other Ambulatory Visit: Payer: Self-pay

## 2023-02-12 MED ORDER — ESOMEPRAZOLE MAGNESIUM 40 MG PO CPDR
40.0000 mg | DELAYED_RELEASE_CAPSULE | Freq: Every day | ORAL | 0 refills | Status: DC
  Filled 2023-02-12: qty 30, 30d supply, fill #0

## 2023-02-13 ENCOUNTER — Other Ambulatory Visit: Payer: Self-pay | Admitting: Medical-Surgical

## 2023-02-15 ENCOUNTER — Other Ambulatory Visit (HOSPITAL_COMMUNITY): Payer: Self-pay

## 2023-02-15 MED ORDER — DAPAGLIFLOZIN PROPANEDIOL 10 MG PO TABS
10.0000 mg | ORAL_TABLET | Freq: Every day | ORAL | 0 refills | Status: DC
  Filled 2023-02-15 (×2): qty 90, 90d supply, fill #0

## 2023-02-17 ENCOUNTER — Other Ambulatory Visit: Payer: Self-pay

## 2023-02-20 ENCOUNTER — Inpatient Hospital Stay (HOSPITAL_COMMUNITY)
Admission: EM | Admit: 2023-02-20 | Discharge: 2023-02-23 | DRG: 872 | Disposition: A | Discharge status: Home / Self Care | Payer: Commercial Managed Care - PPO | Attending: Internal Medicine | Admitting: Internal Medicine

## 2023-02-20 ENCOUNTER — Emergency Department (HOSPITAL_COMMUNITY): Payer: Commercial Managed Care - PPO

## 2023-02-20 ENCOUNTER — Encounter (HOSPITAL_COMMUNITY): Payer: Self-pay | Admitting: Emergency Medicine

## 2023-02-20 DIAGNOSIS — A419 Sepsis, unspecified organism: Principal | ICD-10-CM | POA: Diagnosis present

## 2023-02-20 DIAGNOSIS — Z90710 Acquired absence of both cervix and uterus: Secondary | ICD-10-CM

## 2023-02-20 DIAGNOSIS — N10 Acute tubulo-interstitial nephritis: Secondary | ICD-10-CM | POA: Diagnosis not present

## 2023-02-20 DIAGNOSIS — Z791 Long term (current) use of non-steroidal anti-inflammatories (NSAID): Secondary | ICD-10-CM

## 2023-02-20 DIAGNOSIS — N12 Tubulo-interstitial nephritis, not specified as acute or chronic: Secondary | ICD-10-CM

## 2023-02-20 DIAGNOSIS — Z823 Family history of stroke: Secondary | ICD-10-CM

## 2023-02-20 DIAGNOSIS — K449 Diaphragmatic hernia without obstruction or gangrene: Secondary | ICD-10-CM | POA: Diagnosis not present

## 2023-02-20 DIAGNOSIS — Z808 Family history of malignant neoplasm of other organs or systems: Secondary | ICD-10-CM

## 2023-02-20 DIAGNOSIS — E1165 Type 2 diabetes mellitus with hyperglycemia: Secondary | ICD-10-CM | POA: Diagnosis present

## 2023-02-20 DIAGNOSIS — D6489 Other specified anemias: Secondary | ICD-10-CM | POA: Diagnosis present

## 2023-02-20 DIAGNOSIS — Z833 Family history of diabetes mellitus: Secondary | ICD-10-CM

## 2023-02-20 DIAGNOSIS — Z7984 Long term (current) use of oral hypoglycemic drugs: Secondary | ICD-10-CM

## 2023-02-20 DIAGNOSIS — Z8616 Personal history of COVID-19: Secondary | ICD-10-CM

## 2023-02-20 DIAGNOSIS — Z8249 Family history of ischemic heart disease and other diseases of the circulatory system: Secondary | ICD-10-CM

## 2023-02-20 DIAGNOSIS — Z7985 Long-term (current) use of injectable non-insulin antidiabetic drugs: Secondary | ICD-10-CM

## 2023-02-20 DIAGNOSIS — N3090 Cystitis, unspecified without hematuria: Secondary | ICD-10-CM | POA: Diagnosis present

## 2023-02-20 DIAGNOSIS — I10 Essential (primary) hypertension: Secondary | ICD-10-CM | POA: Diagnosis present

## 2023-02-20 DIAGNOSIS — R935 Abnormal findings on diagnostic imaging of other abdominal regions, including retroperitoneum: Secondary | ICD-10-CM | POA: Diagnosis not present

## 2023-02-20 DIAGNOSIS — E66812 Obesity, class 2: Secondary | ICD-10-CM | POA: Diagnosis present

## 2023-02-20 DIAGNOSIS — Z79899 Other long term (current) drug therapy: Secondary | ICD-10-CM

## 2023-02-20 DIAGNOSIS — E785 Hyperlipidemia, unspecified: Secondary | ICD-10-CM | POA: Diagnosis present

## 2023-02-20 DIAGNOSIS — Z1611 Resistance to penicillins: Secondary | ICD-10-CM | POA: Diagnosis present

## 2023-02-20 DIAGNOSIS — E871 Hypo-osmolality and hyponatremia: Secondary | ICD-10-CM | POA: Diagnosis present

## 2023-02-20 DIAGNOSIS — I959 Hypotension, unspecified: Secondary | ICD-10-CM | POA: Diagnosis present

## 2023-02-20 DIAGNOSIS — Z6835 Body mass index (BMI) 35.0-35.9, adult: Secondary | ICD-10-CM

## 2023-02-20 HISTORY — DX: Tubulo-interstitial nephritis, not specified as acute or chronic: N12

## 2023-02-20 LAB — URINALYSIS, W/ REFLEX TO CULTURE (INFECTION SUSPECTED)
Bacteria, UA: NONE SEEN
Bilirubin Urine: NEGATIVE
Glucose, UA: >=500 mg/dL — AB
Ketones, ur: 20 mg/dL — AB
Nitrite: POSITIVE — AB
Protein, ur: 100 mg/dL — AB
Specific Gravity, Urine: 1.023 (ref 1.005–1.030)
WBC, UA: >50 WBC/hpf (ref 0–5)
pH: 5 (ref 5.0–8.0)

## 2023-02-20 LAB — CBC
HCT: 37.2 % (ref 36.0–46.0)
Hemoglobin: 12 g/dL (ref 12.0–15.0)
MCH: 29.1 pg (ref 26.0–34.0)
MCHC: 32.3 g/dL (ref 30.0–36.0)
MCV: 90.1 fL (ref 80.0–100.0)
Platelets: 200 10*3/uL (ref 150–400)
RBC: 4.13 MIL/uL (ref 3.87–5.11)
RDW: 13.2 % (ref 11.5–15.5)
WBC: 12 10*3/uL — ABNORMAL HIGH (ref 4.0–10.5)
nRBC: 0 % (ref 0.0–0.2)

## 2023-02-20 LAB — LACTIC ACID, PLASMA: Lactic Acid, Venous: 1.1 mmol/L (ref 0.5–1.9)

## 2023-02-20 LAB — BASIC METABOLIC PANEL
Anion gap: 13 (ref 5–15)
BUN: 27 mg/dL — ABNORMAL HIGH (ref 8–23)
CO2: 19 mmol/L — ABNORMAL LOW (ref 22–32)
Calcium: 8.4 mg/dL — ABNORMAL LOW (ref 8.9–10.3)
Chloride: 102 mmol/L (ref 98–111)
Creatinine, Ser: 1.13 mg/dL — ABNORMAL HIGH (ref 0.44–1.00)
GFR, Estimated: 54 mL/min — ABNORMAL LOW (ref >60)
Glucose, Bld: 111 mg/dL — ABNORMAL HIGH (ref 70–99)
Potassium: 4 mmol/L (ref 3.5–5.1)
Sodium: 134 mmol/L — ABNORMAL LOW (ref 135–145)

## 2023-02-20 LAB — GLUCOSE, CAPILLARY
Glucose-Capillary: 107 mg/dL — ABNORMAL HIGH (ref 70–99)
Glucose-Capillary: 125 mg/dL — ABNORMAL HIGH (ref 70–99)

## 2023-02-20 LAB — I-STAT CG4 LACTIC ACID, ED: Lactic Acid, Venous: 0.4 mmol/L — ABNORMAL LOW (ref 0.5–1.9)

## 2023-02-20 LAB — HIV ANTIBODY (ROUTINE TESTING W REFLEX): HIV Screen 4th Generation wRfx: NONREACTIVE

## 2023-02-20 MED ORDER — ACETAMINOPHEN 325 MG PO TABS: 650.0000 mg | ORAL_TABLET | Freq: Once | ORAL | Status: DC

## 2023-02-20 MED ORDER — SODIUM CHLORIDE 0.9 % IV SOLN: INTRAVENOUS | Status: AC

## 2023-02-20 MED ORDER — ONDANSETRON HCL 4 MG PO TABS: 4.0000 mg | ORAL_TABLET | Freq: Four times a day (QID) | ORAL | Status: DC | PRN

## 2023-02-20 MED ORDER — HYDROCODONE-ACETAMINOPHEN 5-325 MG PO TABS
1.0000 | ORAL_TABLET | ORAL | Status: DC | PRN
  Administered 2023-02-20: 1 via ORAL
  Filled 2023-02-20: qty 1

## 2023-02-20 MED ORDER — ACETAMINOPHEN 650 MG RE SUPP: 650.0000 mg | Freq: Four times a day (QID) | RECTAL | Status: DC | PRN

## 2023-02-20 MED ORDER — SODIUM CHLORIDE 0.9 % IV SOLN
1.0000 g | Freq: Once | INTRAVENOUS | Status: AC
  Administered 2023-02-20: 1 g via INTRAVENOUS
  Filled 2023-02-20: qty 10

## 2023-02-20 MED ORDER — ENOXAPARIN SODIUM 40 MG/0.4ML IJ SOSY
40.0000 mg | PREFILLED_SYRINGE | INTRAMUSCULAR | Status: DC
  Administered 2023-02-20 – 2023-02-22 (×3): 40 mg via SUBCUTANEOUS
  Filled 2023-02-20 (×3): qty 0.4

## 2023-02-20 MED ORDER — SODIUM CHLORIDE 0.9 % IV BOLUS
1000.0000 mL | Freq: Once | INTRAVENOUS | Status: AC
  Administered 2023-02-20: 1000 mL via INTRAVENOUS

## 2023-02-20 MED ORDER — KETOROLAC TROMETHAMINE 15 MG/ML IJ SOLN
15.0000 mg | Freq: Once | INTRAMUSCULAR | Status: AC
  Administered 2023-02-20: 15 mg via INTRAVENOUS
  Filled 2023-02-20: qty 1

## 2023-02-20 MED ORDER — ONDANSETRON HCL 4 MG/2ML IJ SOLN
4.0000 mg | Freq: Once | INTRAMUSCULAR | Status: AC
  Administered 2023-02-20: 4 mg via INTRAVENOUS
  Filled 2023-02-20: qty 2

## 2023-02-20 MED ORDER — PANTOPRAZOLE SODIUM 40 MG PO TBEC
80.0000 mg | DELAYED_RELEASE_TABLET | Freq: Every day | ORAL | Status: DC
  Administered 2023-02-21 – 2023-02-22 (×2): 80 mg via ORAL
  Filled 2023-02-20 (×2): qty 2

## 2023-02-20 MED ORDER — CEFTRIAXONE SODIUM 1 G IJ SOLR
1.0000 g | INTRAMUSCULAR | Status: DC
  Administered 2023-02-21 – 2023-02-23 (×3): 1 g via INTRAVENOUS
  Filled 2023-02-20 (×3): qty 10

## 2023-02-20 MED ORDER — INSULIN ASPART 100 UNIT/ML IJ SOLN
0.0000 [IU] | Freq: Three times a day (TID) | INTRAMUSCULAR | Status: DC
  Administered 2023-02-21: 1 [IU] via SUBCUTANEOUS
  Administered 2023-02-21: 2 [IU] via SUBCUTANEOUS
  Administered 2023-02-21: 3 [IU] via SUBCUTANEOUS
  Administered 2023-02-22 (×2): 1 [IU] via SUBCUTANEOUS

## 2023-02-20 MED ORDER — ACETAMINOPHEN 325 MG PO TABS
650.0000 mg | ORAL_TABLET | Freq: Four times a day (QID) | ORAL | Status: DC | PRN
  Administered 2023-02-20 – 2023-02-23 (×2): 650 mg via ORAL
  Filled 2023-02-20 (×3): qty 2

## 2023-02-20 MED ORDER — LABETALOL HCL 5 MG/ML IV SOLN: 5.0000 mg | Freq: Three times a day (TID) | INTRAVENOUS | Status: DC | PRN

## 2023-02-20 MED ORDER — ONDANSETRON HCL 4 MG/2ML IJ SOLN
4.0000 mg | Freq: Four times a day (QID) | INTRAMUSCULAR | Status: DC | PRN
  Administered 2023-02-21: 4 mg via INTRAVENOUS
  Filled 2023-02-20: qty 2

## 2023-02-20 MED ORDER — FLUOXETINE HCL 20 MG PO CAPS
40.0000 mg | ORAL_CAPSULE | Freq: Every day | ORAL | Status: DC
Start: 2023-02-21 — End: 2023-02-23
  Administered 2023-02-21 – 2023-02-23 (×3): 40 mg via ORAL
  Filled 2023-02-20 (×3): qty 2

## 2023-02-20 NOTE — ED Provider Notes (Signed)
[place] EMERGENCY DEPARTMENT AT [facility] Provider Note   CSN: [id number] Arrival date & time: 02/20/23  1121     History  Chief Complaint  Patient presents with   Abdominal Pain   Flank Pain   Fever    Meris Kaehler is a 67 y.o. female with past medical history significant for hypertension, diabetes, hyperlipidemia presents with concern for ongoing dysuria, fever, chills, right flank pain, multiple rounds of vomiting since Wednesday.  Patient recently treated for UTI, she finished 2 rounds of antibiotics including Macrobid, Keflex, she reports no missed doses.  Patient reports mild cough, but no other acute respiratory symptoms, no known sick contacts.  She reports she is around 3 or 4 out of 10 pain at rest.  She reports her most severe symptom is her nausea.   Abdominal Pain Associated symptoms: fever   Flank Pain Associated symptoms include abdominal pain.  Fever      Home Medications Prior to Admission medications   Medication Sig Start Date End Date Taking? Authorizing Provider  amLODipine (NORVASC) 10 MG tablet Take 1 tablet (10 mg total) by mouth daily. 08/24/22   Christen Butter, NP  Blood Glucose Monitoring Suppl (ONETOUCH VERIO FLEX SYSTEM) w/Device KIT Check blood sugar three times daily. 01/15/23   Christen Butter, NP  CALCIUM PO Take 1 tablet by mouth daily.    [provider]  carvedilol (COREG) 12.5 MG tablet Take 1 tablet (12.5 mg total) by mouth 2 (two) times daily with a meal. 08/24/22   Christen Butter, NP  cephALEXin (KEFLEX) 500 MG capsule Take 1 capsule (500 mg total) by mouth 2 (two) times daily. 02/05/23   [name], PA-C  dapagliflozin propanediol (FARXIGA) 10 MG TABS tablet Take 1 tablet (10 mg total) by mouth daily. 02/15/23   Christen Butter, NP  esomeprazole (NEXIUM) 40 MG capsule Take 1 capsule (40 mg total) by mouth daily. 30 minutes before a meal 02/12/23   Christen Butter, NP  FLUoxetine (PROZAC) 40 MG capsule Take 1 capsule (40 mg  total) by mouth daily. 02/09/23   Christen Butter, NP  glipiZIDE (GLUCOTROL) 5 MG tablet Take 1 tablet (5 mg total) by mouth 2 (two) times daily. 08/24/22   Christen Butter, NP  glucose blood (ONETOUCH VERIO) test strip Check blood sugar three times daily. 01/15/23   Christen Butter, NP  hydrochlorothiazide (HYDRODIURIL) 25 MG tablet Take 1 tablet (25 mg total) by mouth daily as needed. 03/05/21   Christen Butter, NP  Lancets 30G MISC Check blood sugar 3 times daily 01/15/23   Christen Butter, NP  losartan (COZAAR) 100 MG tablet Take 1 tablet (100 mg total) by mouth daily. 08/24/22   Christen Butter, NP  meloxicam (MOBIC) 15 MG tablet Take 1 tablet (15 mg total) by mouth daily. 02/04/23   Christen Butter, NP  nitrofurantoin, macrocrystal-monohydrate, (MACROBID) 100 MG capsule Take 1 capsule (100 mg total) by mouth 2 (two) times daily. 01/29/23   Christen Butter, NP  phenazopyridine (PYRIDIUM) 100 MG tablet Take 1 tablet (100 mg total) by mouth 3 (three) times daily as needed for pain. 01/29/23   Christen Butter, NP  prednisoLONE acetate (PRED FORTE) 1 % ophthalmic suspension Place 1 drop into affected eye 4 (four) times daily for 5 days then stop 11/30/22     tirzepatide (MOUNJARO) 7.5 MG/0.5ML Pen Inject 7.5 mg into the skin once a week. 02/04/23   Christen Butter, NP      Allergies    Patient has no  known allergies.    Review of Systems   Review of Systems  Constitutional:  Positive for fever.  Gastrointestinal:  Positive for abdominal pain.  Genitourinary:  Positive for flank pain.    Physical Exam Updated Vital Signs BP (!) 111/50   Pulse 73   Temp 100.1 F (37.8 C)   Resp 17   Ht 5\' 4"  (1.626 m)   Wt 98 kg   SpO2 95%   BMI 37.08 kg/m  Physical Exam Vitals and nursing note reviewed.  Constitutional:      General: She is not in acute distress.    Appearance: Normal appearance.  HENT:     Head: Normocephalic and atraumatic.  Eyes:     General:        Right eye: No discharge.        Left eye: No discharge.   Cardiovascular:     Rate and Rhythm: Normal rate and regular rhythm.     Heart sounds: No murmur heard.    No friction rub. No gallop.  Pulmonary:     Effort: Pulmonary effort is normal.     Breath sounds: Normal breath sounds.  Abdominal:     General: Bowel sounds are normal.     Palpations: Abdomen is soft.     Comments: Mild tenderness to palpation of the right flank, no rebound, rigidity, guarding  Skin:    General: Skin is warm and dry.     Capillary Refill: Capillary refill takes less than 2 seconds.  Neurological:     Mental Status: She is alert and oriented to person, place, and time.  Psychiatric:        Mood and Affect: Mood normal.        Behavior: Behavior normal.     ED Results / Procedures / Treatments   Labs (all labs ordered are listed, but only abnormal results are displayed) Labs Reviewed  CBC - Abnormal; Notable for the following components:      Result Value   WBC 12.0 (*)    All other components within normal limits  BASIC METABOLIC PANEL - Abnormal; Notable for the following components:   Sodium 134 (*)    CO2 19 (*)    Glucose, Bld 111 (*)    BUN 27 (*)    Creatinine, Ser 1.13 (*)    Calcium 8.4 (*)    GFR, Estimated 54 (*)    All other components within normal limits  URINALYSIS, W/ REFLEX TO CULTURE (INFECTION SUSPECTED) - Abnormal; Notable for the following components:   APPearance CLOUDY (*)    Glucose, UA >=500 (*)    Hgb urine dipstick SMALL (*)    Ketones, ur 20 (*)    Protein, ur 100 (*)    Nitrite POSITIVE (*)    Leukocytes,Ua LARGE (*)    Non Squamous Epithelial 0-5 (*)    All other components within normal limits  URINE CULTURE    EKG None  Radiology CT Renal Stone Study  Result Date: 02/20/2023 CLINICAL DATA:  Three day history of right flank pain associated with fevers and chills. Recent treatment for urinary tract infection EXAM: CT ABDOMEN AND PELVIS WITHOUT CONTRAST TECHNIQUE: Multidetector CT imaging of the abdomen  and pelvis was performed following the standard protocol without IV contrast. RADIATION DOSE REDUCTION: This exam was performed according to the departmental dose-optimization program which includes automated exposure control, adjustment of the mA and/or kV according to patient size and/or use of iterative reconstruction technique. COMPARISON:  CT abdomen and pelvis dated 10/13/2012 FINDINGS: Lower chest: No focal consolidation or pulmonary nodule in the lung bases. No pleural effusion or pneumothorax demonstrated. Partially imaged heart size is normal. Coronary artery calcifications. Hepatobiliary: No focal hepatic lesions. No intra or extrahepatic biliary ductal dilation. Cholecystectomy. Pancreas: No focal lesions or main ductal dilation. Spleen: Normal in size without focal abnormality. Adrenals/Urinary Tract: No adrenal nodules. Asymmetrically enlarged right kidney with perinephric stranding. No suspicious renal masses by noncontrast technique. No renal calculi or hydronephrosis. Underdistended urinary bladder with mild surrounding stranding. Stomach/Bowel: Small hiatal hernia. Normal appearance of the stomach. No evidence of bowel wall thickening, distention, or inflammatory changes. Colonic diverticulosis without acute diverticulitis. Normal appendix. Vascular/Lymphatic: Aortic atherosclerosis. No enlarged abdominal or pelvic lymph nodes. Reproductive: No adnexal masses. Other: No free fluid, fluid collection, or free air. Musculoskeletal: No acute or abnormal lytic or blastic osseous lesions. Multilevel degenerative changes of the partially imaged thoracic and lumbar spine. Small fat-containing paraumbilical hernias. IMPRESSION: 1. Asymmetrically enlarged right kidney with perinephric stranding, suspicious for pyelonephritis. 2. Underdistended urinary bladder with mild surrounding stranding, which may be related to cystitis. 3. Small hiatal hernia. 4. Small fat-containing paraumbilical hernia. 5. Aortic  Atherosclerosis (ICD10-I70.0). Coronary artery calcifications. Assessment for potential risk factor modification, dietary therapy or pharmacologic therapy may be warranted, if clinically indicated. Electronically Signed   By: Agustin Cree M.D.   On: 02/20/2023 14:04    Procedures Procedures    Medications Ordered in ED Medications  sodium chloride 0.9 % bolus 1,000 mL (1,000 mLs Intravenous New Bag/Given 02/20/23 1256)  cefTRIAXone (ROCEPHIN) 1 g in sodium chloride 0.9 % 100 mL IVPB (0 g Intravenous Stopped 02/20/23 1400)  ketorolac (TORADOL) 15 MG/ML injection 15 mg (15 mg Intravenous Given 02/20/23 1257)  ondansetron (ZOFRAN) injection 4 mg (4 mg Intravenous Given 02/20/23 1256)    ED Course/ Medical Decision Making/ A&P Clinical Course as of 02/20/23 1422  Sat Feb 20, 2023  1229 right flank irritation, dysuria, since wednesday. Having nausea, chills, bodyaches. tylenol and fluids at home without relief. [CP]    Clinical Course User Index [CP] Olene Floss, PA-C                                 Medical Decision Making Amount and/or Complexity of Data Reviewed Labs: ordered. Radiology: ordered.  Risk Prescription drug management.   This patient is a 67 y.o. female who presents to the ED for concern of flank pain, this involves an extensive number of treatment options, and is a complaint that carries with it a high risk of complications and morbidity. The emergent differential diagnosis prior to evaluation includes, but is not limited to,  AAA, renal artery/vein embolism/thrombosis, mesenteric ischemia, pyelonephritis, nephrolithiasis, cystitis, biliary colic, pancreatitis, perforated peptic ulcer, appendicitis, diverticulitis, bowel obstruction . This is not an exhaustive differential.   Past Medical History / Co-morbidities / Social History: hypertension, diabetes, hyperlipidemia  Additional history: Chart reviewed. Pertinent results include: Extensively reviewed lab  work, imaging including recent urinalysis including urine culture  Physical Exam: Physical exam performed. The pertinent findings include:  Mild tenderness to palpation of the right flank, no rebound, rigidity, guarding , some elevated temp on arrival, 100.1 fahrenheit, stable pulse, respiratory rate  Lab Tests: I ordered, and personally interpreted labs.  The pertinent results include: CBC notable for leukocytosis, white blood cells 12, BMP with mild hyponatremia, sodium 134, elevated BUN, creatinine, 27, 1.13 respectively,  mild bicarb deficit at 19 without significant anion gap.  UA with nitrate positive, leukocyte positive UTI with greater than 50 white blood cells and white blood cell clumps.  Will send for culture specially given failure to outpatient antibiotics.   Imaging Studies: I ordered imaging studies including CT renal stone study. I independently visualized and interpreted imaging which showed significant right-sided hydronephrosis without obstructing stone, suspicious for pyelonephritis. I agree with the radiologist interpretation.   Medications: I ordered medication including rocephin for pyelonephritis, Toradol, Zofran, fluid bolus for dehydration, nausea, pain, fever.   Consultations Obtained: I requested consultation with the hospitalist, spoke with Dr. Artis Flock,  and discussed lab and imaging findings as well as pertinent plan - they recommend: Hospital admission for pyelonephritis, IV antibiotics   Disposition: After consideration of the diagnostic results and the patients response to treatment, I feel that patient would benefit from admission as discussed above.   I discussed this case with my attending physician Dr. Rodena Medin who cosigned this note including patient's presenting symptoms, physical exam, and planned diagnostics and interventions. Attending physician stated agreement with plan or made changes to plan which were implemented.    Final Clinical Impression(s) / ED  Diagnoses Final diagnoses:  Pyelonephritis    Rx / DC Orders ED Discharge Orders     None         [place] 02/20/23 1422    Wynetta Fines, MD 02/20/23 1559

## 2023-02-20 NOTE — Assessment & Plan Note (Addendum)
Blood pressure soft/trending down Hold oral meds-norvasc, coreg, losartan  PRN IV labetalol with parameters  IVF x 1 day

## 2023-02-20 NOTE — Assessment & Plan Note (Signed)
A1C of 5.4 in May of 2024  Hold glipizide/mounjaro in patient  Would d/c farxiga with pyelo and A1c of 5.4  SSI and accuchecks qac/hs

## 2023-02-20 NOTE — Progress Notes (Signed)
MEWS Progress Note  Patient Details Name: Alicia Pineda MRN: [id number] DOB: 1956/03/18 Today's Date: 02/20/2023   MEWS Flowsheet Documentation:  Assess: MEWS Score Temp: 98.1 F (36.7 C) BP: (!) 123/45 MAP (mmHg): 67 Pulse Rate: 83 ECG Heart Rate: 66 Resp: 17 Level of Consciousness: Alert SpO2: 98 % O2 Device: Room Air Assess: MEWS Score MEWS Temp: 0 MEWS Systolic: 0 MEWS Pulse: 0 MEWS RR: 0 MEWS LOC: 0 MEWS Score: 0 MEWS Score Color: Green Assess: SIRS CRITERIA SIRS Temperature : 0 SIRS Respirations : 0 SIRS Pulse: 0 SIRS WBC: 0 SIRS Score Sum : 1 SIRS Temperature : 0 SIRS Pulse: 0 SIRS Respirations : 0 Assess: if the MEWS score is Yellow or Red Were vital signs accurate and taken at a resting state?: Yes Does the patient meet 2 or more of the SIRS criteria?: Yes Does the patient have a confirmed or suspected source of infection?: Yes MEWS guidelines implemented : Yes, yellow Treat MEWS Interventions: Considered administering scheduled or prn medications/treatments as ordered Take Vital Signs Increase Vital Sign Frequency : Yellow: Q2hr x1, continue Q4hrs until patient remains green for 12hrs Escalate MEWS: Escalate: Yellow: Discuss with charge nurse and consider notifying provider and/or RRT        Denton Meek 02/20/2023, 10:56 PM

## 2023-02-20 NOTE — Plan of Care (Signed)

## 2023-02-20 NOTE — H&P (Addendum)
History and Physical    Patient: Alicia Pineda KGM:[id number] DOB: [birth year] DOA: 02/20/2023 DOS: the patient was seen and examined on 02/20/2023 PCP: Christen Butter, NP  Patient coming from: Home - lives with her sister. Ambulates independently    Chief Complaint: right sided CVA tenderness/fever/nausea and vomiting.   HPI: Alicia Pineda is a 67 y.o. female with medical history significant of T2DM, HTN who presented to Ed with complaints of right flank pain,dysuria  She had a UTI a few weeks ago and was given a course of macrobid. As soon as it was over she had urgency, dysuria again and then was put on course of keflex on 10/25. Urine culture was sensitive to both meds.  After she finished keflex still had a twinge of dysuria. Wednesday this week she started to have chills, fever, body aches, right sided CVA tenderness and vomiting. . She thought this was a 24 bug and felt a little better. She was able to keep food down. On Friday the fever/chills, right sided CVA tenderness returned and she came to ED today. Tmax of 103.8. She has supra pubic pain, dysuria, incontinence, urgency and frequency.   Denies vision changes, chest pain or palpitations, shortness of breath or cough, diarrhea, vomiting or leg swelling.   She does not smoke or drink alcohol.   ER Course:  vitals: 100.1, bp: 142/66, HR: 81 RR: 20, oxygen: 100%RA Pertinent labs: wbc: 12.0, BUN: 27, creatinine: 1.13, c02:19  CT renal study: Asymmetrically enlarged right kidney with perinephric stranding, suspicious for pyelonephritis. 2. Underdistended urinary bladder with mild surrounding stranding, which may be related to cystitis. 3. Small hiatal hernia. 4. Small fat-containing paraumbilical hernia. 5. Aortic Atherosclerosis (ICD10-I70.0). Coronary artery calcifications. Assessment for potential risk factor modification, dietary therapy or pharmacologic therapy may be warranted, if clinically indicated. In ED: started on  rocephin, given 1L IVF bolus, toradol and zofran. TRH asked to admit.    Review of Systems: As mentioned in the history of present illness. All other systems reviewed and are negative. Past Medical History:  Diagnosis Date   COVID-19 04/2020   also had in 2020   Diabetes mellitus without complication (HCC)    Hypertension    Past Surgical History:  Procedure Laterality Date   ABDOMINAL HYSTERECTOMY     2007   CATARACT EXTRACTION     CHOLECYSTECTOMY     TUBAL LIGATION     Social History:  reports that she has never smoked. She has never used smokeless tobacco. She reports that she does not currently use alcohol. She reports that she does not use drugs.  No Known Allergies  Family History  Problem Relation Age of Onset   Hypertension Mother    Heart failure Mother    Diabetes Mother    Skin cancer Mother    Hypertension Father    Heart failure Father    Diabetes Father    Prostate cancer Father    Diabetes Sister    Heart failure Brother    Hypertension Maternal Grandfather    Heart attack Maternal Grandfather    Stroke Maternal Grandfather    Colon cancer Neg Hx    Stomach cancer Neg Hx    Esophageal cancer Neg Hx    Pancreatic cancer Neg Hx     Prior to Admission medications   Medication Sig Start Date End Date Taking? Authorizing Provider  amLODipine (NORVASC) 10 MG tablet Take 1 tablet (10 mg total) by mouth daily. 08/24/22   Christen Butter, NP  Blood Glucose Monitoring Suppl (ONETOUCH VERIO FLEX SYSTEM) w/Device KIT Check blood sugar three times daily. 01/15/23   Christen Butter, NP  CALCIUM PO Take 1 tablet by mouth daily.    [provider]  carvedilol (COREG) 12.5 MG tablet Take 1 tablet (12.5 mg total) by mouth 2 (two) times daily with a meal. 08/24/22   Christen Butter, NP  cephALEXin (KEFLEX) 500 MG capsule Take 1 capsule (500 mg total) by mouth 2 (two) times daily. 02/05/23   [name], PA-C  dapagliflozin propanediol (FARXIGA) 10 MG TABS tablet Take  1 tablet (10 mg total) by mouth daily. 02/15/23   Christen Butter, NP  esomeprazole (NEXIUM) 40 MG capsule Take 1 capsule (40 mg total) by mouth daily. 30 minutes before a meal 02/12/23   Christen Butter, NP  FLUoxetine (PROZAC) 40 MG capsule Take 1 capsule (40 mg total) by mouth daily. 02/09/23   Christen Butter, NP  glipiZIDE (GLUCOTROL) 5 MG tablet Take 1 tablet (5 mg total) by mouth 2 (two) times daily. 08/24/22   Christen Butter, NP  glucose blood (ONETOUCH VERIO) test strip Check blood sugar three times daily. 01/15/23   Christen Butter, NP  hydrochlorothiazide (HYDRODIURIL) 25 MG tablet Take 1 tablet (25 mg total) by mouth daily as needed. 03/05/21   Christen Butter, NP  Lancets 30G MISC Check blood sugar 3 times daily 01/15/23   Christen Butter, NP  losartan (COZAAR) 100 MG tablet Take 1 tablet (100 mg total) by mouth daily. 08/24/22   Christen Butter, NP  meloxicam (MOBIC) 15 MG tablet Take 1 tablet (15 mg total) by mouth daily. 02/04/23   Christen Butter, NP  nitrofurantoin, macrocrystal-monohydrate, (MACROBID) 100 MG capsule Take 1 capsule (100 mg total) by mouth 2 (two) times daily. 01/29/23   Christen Butter, NP  phenazopyridine (PYRIDIUM) 100 MG tablet Take 1 tablet (100 mg total) by mouth 3 (three) times daily as needed for pain. 01/29/23   Christen Butter, NP  prednisoLONE acetate (PRED FORTE) 1 % ophthalmic suspension Place 1 drop into affected eye 4 (four) times daily for 5 days then stop 11/30/22     tirzepatide (MOUNJARO) 7.5 MG/0.5ML Pen Inject 7.5 mg into the skin once a week. 02/04/23   Christen Butter, NP    Physical Exam: Vitals:   02/20/23 1445 02/20/23 1500 02/20/23 1538 02/20/23 1542  BP: (!) 116/54 (!) 103/50  (!) 103/52  Pulse: 70 68  65  Resp: 13 13  17   Temp:  98.7 F (37.1 C)  98.9 F (37.2 C)  TempSrc:    Oral  SpO2: 95% 95%  96%  Weight:   94.3 kg   Height:   5\' 4"  (1.626 m)    General:  Appears calm and comfortable and is in NAD, diaphoretic  Eyes:  PERRL, EOMI, normal lids, iris ENT:  grossly normal  hearing, lips & tongue, mmm; appropriate dentition Neck:  no LAD, masses or thyromegaly; no carotid bruits Cardiovascular:  RRR, no m/r/g. No LE edema.  Respiratory:   CTA bilaterally with no wheezes/rales/rhonchi.  Normal respiratory effort. Abdomen:  soft, NT, ND, NABS Back:   normal alignment, +right sided CVA tenderness  Skin:  no rash or induration seen on limited exam Musculoskeletal:  grossly normal tone BUE/BLE, good ROM, no bony abnormality Lower extremity:  No LE edema.  Limited foot exam with no ulcerations.  2+ distal pulses. Psychiatric:  grossly normal mood and affect, speech fluent and appropriate, AOx3 Neurologic:  CN 2-12 grossly intact, moves  all extremities in coordinated fashion, sensation intact   Radiological Exams on Admission: Independently reviewed - see discussion in A/P where applicable  CT Renal Stone Study  Result Date: 02/20/2023 CLINICAL DATA:  Three day history of right flank pain associated with fevers and chills. Recent treatment for urinary tract infection EXAM: CT ABDOMEN AND PELVIS WITHOUT CONTRAST TECHNIQUE: Multidetector CT imaging of the abdomen and pelvis was performed following the standard protocol without IV contrast. RADIATION DOSE REDUCTION: This exam was performed according to the departmental dose-optimization program which includes automated exposure control, adjustment of the mA and/or kV according to patient size and/or use of iterative reconstruction technique. COMPARISON:  CT abdomen and pelvis dated 10/13/2012 FINDINGS: Lower chest: No focal consolidation or pulmonary nodule in the lung bases. No pleural effusion or pneumothorax demonstrated. Partially imaged heart size is normal. Coronary artery calcifications. Hepatobiliary: No focal hepatic lesions. No intra or extrahepatic biliary ductal dilation. Cholecystectomy. Pancreas: No focal lesions or main ductal dilation. Spleen: Normal in size without focal abnormality. Adrenals/Urinary Tract: No  adrenal nodules. Asymmetrically enlarged right kidney with perinephric stranding. No suspicious renal masses by noncontrast technique. No renal calculi or hydronephrosis. Underdistended urinary bladder with mild surrounding stranding. Stomach/Bowel: Small hiatal hernia. Normal appearance of the stomach. No evidence of bowel wall thickening, distention, or inflammatory changes. Colonic diverticulosis without acute diverticulitis. Normal appendix. Vascular/Lymphatic: Aortic atherosclerosis. No enlarged abdominal or pelvic lymph nodes. Reproductive: No adnexal masses. Other: No free fluid, fluid collection, or free air. Musculoskeletal: No acute or abnormal lytic or blastic osseous lesions. Multilevel degenerative changes of the partially imaged thoracic and lumbar spine. Small fat-containing paraumbilical hernias. IMPRESSION: 1. Asymmetrically enlarged right kidney with perinephric stranding, suspicious for pyelonephritis. 2. Underdistended urinary bladder with mild surrounding stranding, which may be related to cystitis. 3. Small hiatal hernia. 4. Small fat-containing paraumbilical hernia. 5. Aortic Atherosclerosis (ICD10-I70.0). Coronary artery calcifications. Assessment for potential risk factor modification, dietary therapy or pharmacologic therapy may be warranted, if clinically indicated. Electronically Signed   By: Agustin Cree M.D.   On: 02/20/2023 14:04     Labs on Admission: I have personally reviewed the available labs and imaging studies at the time of the admission.  Pertinent labs:   wbc: 12.0,  BUN: 27,  creatinine: 1.13,  Co2:19  Assessment and Plan: Principal Problem:   sepsis secondary to acute pyelonephritis Active Problems:   Type 2 diabetes mellitus with hyperglycemia, without long-term current use of insulin (HCC)   Essential hypertension   Sepsis (HCC)    Assessment and Plan: * sepsis secondary to acute pyelonephritis 67 year old presenting with 4 day fever, chills, dysuria,  right sided CVA tenderness and findings on CT consistent with acute pyelonephritis meeting sepsis criteria  -obs to med surg -hx of UTI on 10/23 treated with course of macrobid and urine culture sensitive to this. Continue to have symtpom so treated with course of keflex given on 11/1. Continued to have intermittent symptoms until this week with worsening symptoms of fever/right flank pain, nausea, dysuria -stopping her farxiga and would not resume  -culture pending -continue rocephin  -check BC/lactic acid  -tylenol/norco for pain  -zofran prn for nausea  -IVF x 1 day   Type 2 diabetes mellitus with hyperglycemia, without long-term current use of insulin (HCC) A1C of 5.4 in May of 2024  Hold glipizide/mounjaro in patient  Would d/c farxiga with pyelo and A1c of 5.4  SSI and accuchecks qac/hs   Essential hypertension Blood pressure soft/trending down Hold  oral meds-norvasc, coreg, losartan  PRN IV labetalol with parameters  IVF x 1 day     Advance Care Planning:   Code Status: Full Code  Consults: none   DVT Prophylaxis: lovenox   Family Communication: sister at bedside   Severity of Illness: The appropriate patient status for this patient is OBSERVATION. Observation status is judged to be reasonable and necessary in order to provide the required intensity of service to ensure the patient's safety. The patient's presenting symptoms, physical exam findings, and initial radiographic and laboratory data in the context of their medical condition is felt to place them at decreased risk for further clinical deterioration. Furthermore, it is anticipated that the patient will be medically stable for discharge from the hospital within 2 midnights of admission.   Author: Orland Mustard, MD 02/20/2023 6:25 PM  For on call review [email or web address].

## 2023-02-20 NOTE — Assessment & Plan Note (Addendum)
67 year old presenting with 4 day fever, chills, dysuria, right sided CVA tenderness and findings on CT consistent with acute pyelonephritis meeting sepsis criteria  -obs to med surg -hx of UTI on 10/23 treated with course of macrobid and urine culture sensitive to this. Continue to have symtpom so treated with course of keflex given on 11/1. Continued to have intermittent symptoms until this week with worsening symptoms of fever/right flank pain, nausea, dysuria -stopping her farxiga and would not resume  -culture pending -continue rocephin  -check BC/lactic acid  -tylenol/norco for pain  -zofran prn for nausea  -IVF x 1 day

## 2023-02-20 NOTE — ED Triage Notes (Signed)
PT recently treated for UTI, finished 2 rounds of antibiotics, Wednesday after work began having fever, chills and right flank pain. Pt reports multiple episodes of vomiting.

## 2023-02-20 NOTE — ED Notes (Signed)
ED TO INPATIENT HANDOFF REPORT  ED Nurse Name and Phone #: Beatris Ship RN [phone]  S Name/Age/Gender Alicia Pineda 67 y.o. female Room/Bed: 031C/031C  Code Status   Code Status: Not on file  Home/SNF/Other Home Patient oriented to: self, place, time, and situation Is this baseline? Yes   Triage Complete: Triage complete  Chief Complaint Pyelonephritis [N12]  Triage Note PT recently treated for UTI, finished 2 rounds of antibiotics, Wednesday after work began having fever, chills and right flank pain. Pt reports multiple episodes of vomiting.    Allergies No Known Allergies  Level of Care/Admitting Diagnosis ED Disposition     ED Disposition  Admit   Condition  --   Comment  Hospital Area: [facility] [[id number]]  Level of Care: Med-Surg [16]  May place patient in observation at [facility] or [facility] if equivalent level of care is available:: No  Covid Evaluation: Asymptomatic - no recent exposure (last 10 days) testing not required  Diagnosis: Pyelonephritis [119147]  Admitting Physician: Orland Mustard [[id number]]  Attending Physician: Orland Mustard [[id number]]          B Medical/Surgery History Past Medical History:  Diagnosis Date   COVID-19 04/2020   also had in 2020   Diabetes mellitus without complication (HCC)    Hypertension    Past Surgical History:  Procedure Laterality Date   ABDOMINAL HYSTERECTOMY     2007   CATARACT EXTRACTION     CHOLECYSTECTOMY     TUBAL LIGATION       A IV Location/Drains/Wounds Patient Lines/Drains/Airways Status     Active Line/Drains/Airways     Name Placement date Placement time Site Days   Peripheral IV 02/20/23 20 G Anterior;Right Hand 02/20/23  1252  Hand  less than 1            Intake/Output Last 24 hours No intake or output data in the 24 hours ending 02/20/23 1437  Labs/Imaging Results for orders placed or performed during the hospital encounter of 02/20/23 (from the past 48  hour(s))  Urinalysis, w/ Reflex to Culture (Infection Suspected) -Urine, Clean Catch     Status: Abnormal   Collection Time: 02/20/23 11:44 AM  Result Value Ref Range   Specimen Source URINE, CLEAN CATCH    Color, Urine YELLOW YELLOW   APPearance CLOUDY (A) CLEAR   Specific Gravity, Urine 1.023 1.005 - 1.030   pH 5.0 5.0 - 8.0   Glucose, UA >=500 (A) NEGATIVE mg/dL   Hgb urine dipstick SMALL (A) NEGATIVE   Bilirubin Urine NEGATIVE NEGATIVE   Ketones, ur 20 (A) NEGATIVE mg/dL   Protein, ur 469 (A) NEGATIVE mg/dL   Nitrite POSITIVE (A) NEGATIVE   Leukocytes,Ua LARGE (A) NEGATIVE   RBC / HPF 6-10 0 - 5 RBC/hpf   WBC, UA >50 0 - 5 WBC/hpf    Comment:        Reflex urine culture not performed if WBC <=10, OR if Squamous epithelial cells >5. If Squamous epithelial cells >5 suggest recollection.    Bacteria, UA NONE SEEN NONE SEEN   Squamous Epithelial / HPF 0-5 0 - 5 /HPF   WBC Clumps PRESENT    Mucus PRESENT    Non Squamous Epithelial 0-5 (A) NONE SEEN    Comment: Performed at [facility], [address] [place], [place] [address]  CBC     Status: Abnormal   Collection Time: 02/20/23 11:48 AM  Result Value Ref Range   WBC 12.0 (  H) 4.0 - 10.5 K/uL   RBC 4.13 3.87 - 5.11 MIL/uL   Hemoglobin 12.0 12.0 - 15.0 g/dL   HCT 40.9 81.1 - 91.4 %   MCV 90.1 80.0 - 100.0 fL   MCH 29.1 26.0 - 34.0 pg   MCHC 32.3 30.0 - 36.0 g/dL   RDW 78.2 95.6 - 21.3 %   Platelets 200 150 - 400 K/uL   nRBC 0.0 0.0 - 0.2 %    Comment: Performed at [facility], [address] [place], [place] [address]  Basic metabolic panel     Status: Abnormal   Collection Time: 02/20/23 11:48 AM  Result Value Ref Range   Sodium 134 (L) 135 - 145 mmol/L   Potassium 4.0 3.5 - 5.1 mmol/L   Chloride 102 98 - 111 mmol/L   CO2 19 (L) 22 - 32 mmol/L   Glucose, Bld 111 (H) 70 - 99 mg/dL    Comment: Glucose reference range applies only to samples taken after fasting for at least 8 hours.   BUN 27 (H) 8 - 23  mg/dL   Creatinine, Ser 8.46 (H) 0.44 - 1.00 mg/dL   Calcium 8.4 (L) 8.9 - 10.3 mg/dL   GFR, Estimated 54 (L) >60 mL/min    Comment: (NOTE) Calculated using the CKD-EPI Creatinine Equation (2021)    Anion gap 13 5 - 15    Comment: Performed at [facility], [address] [place], [place] [address]   CT Renal Stone Study  Result Date: 02/20/2023 CLINICAL DATA:  Three day history of right flank pain associated with fevers and chills. Recent treatment for urinary tract infection EXAM: CT ABDOMEN AND PELVIS WITHOUT CONTRAST TECHNIQUE: Multidetector CT imaging of the abdomen and pelvis was performed following the standard protocol without IV contrast. RADIATION DOSE REDUCTION: This exam was performed according to the departmental dose-optimization program which includes automated exposure control, adjustment of the mA and/or kV according to patient size and/or use of iterative reconstruction technique. COMPARISON:  CT abdomen and pelvis dated 10/13/2012 FINDINGS: Lower chest: No focal consolidation or pulmonary nodule in the lung bases. No pleural effusion or pneumothorax demonstrated. Partially imaged heart size is normal. Coronary artery calcifications. Hepatobiliary: No focal hepatic lesions. No intra or extrahepatic biliary ductal dilation. Cholecystectomy. Pancreas: No focal lesions or main ductal dilation. Spleen: Normal in size without focal abnormality. Adrenals/Urinary Tract: No adrenal nodules. Asymmetrically enlarged right kidney with perinephric stranding. No suspicious renal masses by noncontrast technique. No renal calculi or hydronephrosis. Underdistended urinary bladder with mild surrounding stranding. Stomach/Bowel: Small hiatal hernia. Normal appearance of the stomach. No evidence of bowel wall thickening, distention, or inflammatory changes. Colonic diverticulosis without acute diverticulitis. Normal appendix. Vascular/Lymphatic: Aortic atherosclerosis. No enlarged abdominal or  pelvic lymph nodes. Reproductive: No adnexal masses. Other: No free fluid, fluid collection, or free air. Musculoskeletal: No acute or abnormal lytic or blastic osseous lesions. Multilevel degenerative changes of the partially imaged thoracic and lumbar spine. Small fat-containing paraumbilical hernias. IMPRESSION: 1. Asymmetrically enlarged right kidney with perinephric stranding, suspicious for pyelonephritis. 2. Underdistended urinary bladder with mild surrounding stranding, which may be related to cystitis. 3. Small hiatal hernia. 4. Small fat-containing paraumbilical hernia. 5. Aortic Atherosclerosis (ICD10-I70.0). Coronary artery calcifications. Assessment for potential risk factor modification, dietary therapy or pharmacologic therapy may be warranted, if clinically indicated. Electronically Signed   By: Agustin Cree M.D.   On: 02/20/2023 14:04    Pending Labs Unresulted Labs (From admission, onward)  Start     Ordered   02/20/23 1426  Culture, blood (Routine X 2) w Reflex to ID Panel  BLOOD CULTURE X 2,   R (with TIMED occurrences)      02/20/23 1425   02/20/23 1144  Urine Culture  Once,   R        02/20/23 1144            Vitals/Pain Today's Vitals   02/20/23 1141 02/20/23 1142 02/20/23 1303 02/20/23 1400  BP:   (!) 128/53 (!) 111/50  Pulse:   78 73  Resp:   19 17  Temp:      SpO2:   98% 95%  Weight: 98 kg     Height: 5\' 4"  (1.626 m)     PainSc:  3       Isolation Precautions No active isolations  Medications Medications  sodium chloride 0.9 % bolus 1,000 mL (1,000 mLs Intravenous New Bag/Given 02/20/23 1256)  cefTRIAXone (ROCEPHIN) 1 g in sodium chloride 0.9 % 100 mL IVPB (0 g Intravenous Stopped 02/20/23 1400)  ketorolac (TORADOL) 15 MG/ML injection 15 mg (15 mg Intravenous Given 02/20/23 1257)  ondansetron (ZOFRAN) injection 4 mg (4 mg Intravenous Given 02/20/23 1256)    Mobility walks      R Recommendations: See Admitting Provider Note  Report given to:  [id number]

## 2023-02-21 DIAGNOSIS — N10 Acute tubulo-interstitial nephritis: Secondary | ICD-10-CM | POA: Diagnosis not present

## 2023-02-21 DIAGNOSIS — Z7984 Long term (current) use of oral hypoglycemic drugs: Secondary | ICD-10-CM | POA: Diagnosis not present

## 2023-02-21 DIAGNOSIS — Z8249 Family history of ischemic heart disease and other diseases of the circulatory system: Secondary | ICD-10-CM | POA: Diagnosis not present

## 2023-02-21 DIAGNOSIS — N12 Tubulo-interstitial nephritis, not specified as acute or chronic: Secondary | ICD-10-CM | POA: Diagnosis not present

## 2023-02-21 DIAGNOSIS — I959 Hypotension, unspecified: Secondary | ICD-10-CM | POA: Diagnosis present

## 2023-02-21 DIAGNOSIS — Z808 Family history of malignant neoplasm of other organs or systems: Secondary | ICD-10-CM | POA: Diagnosis not present

## 2023-02-21 DIAGNOSIS — E66812 Obesity, class 2: Secondary | ICD-10-CM | POA: Diagnosis not present

## 2023-02-21 DIAGNOSIS — Z79899 Other long term (current) drug therapy: Secondary | ICD-10-CM | POA: Diagnosis not present

## 2023-02-21 DIAGNOSIS — Z823 Family history of stroke: Secondary | ICD-10-CM | POA: Diagnosis not present

## 2023-02-21 DIAGNOSIS — Z6835 Body mass index (BMI) 35.0-35.9, adult: Secondary | ICD-10-CM | POA: Diagnosis not present

## 2023-02-21 DIAGNOSIS — N3090 Cystitis, unspecified without hematuria: Secondary | ICD-10-CM | POA: Diagnosis present

## 2023-02-21 DIAGNOSIS — Z8616 Personal history of COVID-19: Secondary | ICD-10-CM | POA: Diagnosis not present

## 2023-02-21 DIAGNOSIS — E871 Hypo-osmolality and hyponatremia: Secondary | ICD-10-CM | POA: Diagnosis not present

## 2023-02-21 DIAGNOSIS — Z791 Long term (current) use of non-steroidal anti-inflammatories (NSAID): Secondary | ICD-10-CM | POA: Diagnosis not present

## 2023-02-21 DIAGNOSIS — E1165 Type 2 diabetes mellitus with hyperglycemia: Secondary | ICD-10-CM | POA: Diagnosis not present

## 2023-02-21 DIAGNOSIS — Z833 Family history of diabetes mellitus: Secondary | ICD-10-CM | POA: Diagnosis not present

## 2023-02-21 DIAGNOSIS — I10 Essential (primary) hypertension: Secondary | ICD-10-CM | POA: Diagnosis not present

## 2023-02-21 DIAGNOSIS — Z1611 Resistance to penicillins: Secondary | ICD-10-CM | POA: Diagnosis not present

## 2023-02-21 DIAGNOSIS — Z7985 Long-term (current) use of injectable non-insulin antidiabetic drugs: Secondary | ICD-10-CM | POA: Diagnosis not present

## 2023-02-21 DIAGNOSIS — E785 Hyperlipidemia, unspecified: Secondary | ICD-10-CM | POA: Diagnosis present

## 2023-02-21 DIAGNOSIS — Z90710 Acquired absence of both cervix and uterus: Secondary | ICD-10-CM | POA: Diagnosis not present

## 2023-02-21 DIAGNOSIS — D6489 Other specified anemias: Secondary | ICD-10-CM | POA: Diagnosis not present

## 2023-02-21 DIAGNOSIS — A419 Sepsis, unspecified organism: Secondary | ICD-10-CM | POA: Diagnosis not present

## 2023-02-21 LAB — CBC
HCT: 34 % — ABNORMAL LOW (ref 36.0–46.0)
Hemoglobin: 11.3 g/dL — ABNORMAL LOW (ref 12.0–15.0)
MCH: 30 pg (ref 26.0–34.0)
MCHC: 33.2 g/dL (ref 30.0–36.0)
MCV: 90.2 fL (ref 80.0–100.0)
Platelets: 171 10*3/uL (ref 150–400)
RBC: 3.77 MIL/uL — ABNORMAL LOW (ref 3.87–5.11)
RDW: 13.3 % (ref 11.5–15.5)
WBC: 10.7 10*3/uL — ABNORMAL HIGH (ref 4.0–10.5)
nRBC: 0 % (ref 0.0–0.2)

## 2023-02-21 LAB — BASIC METABOLIC PANEL
Anion gap: 8 (ref 5–15)
BUN: 24 mg/dL — ABNORMAL HIGH (ref 8–23)
CO2: 21 mmol/L — ABNORMAL LOW (ref 22–32)
Calcium: 8.3 mg/dL — ABNORMAL LOW (ref 8.9–10.3)
Chloride: 105 mmol/L (ref 98–111)
Creatinine, Ser: 1.01 mg/dL — ABNORMAL HIGH (ref 0.44–1.00)
GFR, Estimated: >60 mL/min (ref >60)
Glucose, Bld: 127 mg/dL — ABNORMAL HIGH (ref 70–99)
Potassium: 4 mmol/L (ref 3.5–5.1)
Sodium: 134 mmol/L — ABNORMAL LOW (ref 135–145)

## 2023-02-21 LAB — GLUCOSE, CAPILLARY
Glucose-Capillary: 207 mg/dL — ABNORMAL HIGH (ref 70–99)
Glucose-Capillary: 139 mg/dL — ABNORMAL HIGH (ref 70–99)
Glucose-Capillary: 158 mg/dL — ABNORMAL HIGH (ref 70–99)
Glucose-Capillary: 171 mg/dL — ABNORMAL HIGH (ref 70–99)

## 2023-02-21 MED ORDER — HYDROCODONE-ACETAMINOPHEN 5-325 MG PO TABS: 1.0000 | ORAL_TABLET | Freq: Four times a day (QID) | ORAL | Status: DC | PRN

## 2023-02-21 MED ORDER — IBUPROFEN 600 MG PO TABS: 600.0000 mg | ORAL_TABLET | ORAL | Status: DC | PRN

## 2023-02-21 NOTE — Plan of Care (Signed)

## 2023-02-21 NOTE — Progress Notes (Signed)
PROGRESS NOTE    Alicia Pineda  YNW:[id number] DOB: [birth year] DOA: 02/20/2023 PCP: Christen Butter, NP   Brief Narrative:  Alicia Pineda is a 67 y.o. female with medical history significant of T2DM, HTN who presented to ED with complaints of right flank pain,dysuria having been diagnosed with UTI previously now having attempted keflex as well as macrobid with ongoing/worsening symptoms.   Assessment & Plan:   Principal Problem:   sepsis secondary to acute pyelonephritis Active Problems:   Type 2 diabetes mellitus with hyperglycemia, without long-term current use of insulin (HCC)   Essential hypertension   Sepsis (HCC)   Sepsis secondary to acute pyelonephritis -Failure of outpatient antibiotics x2 (macrobid/keflex) -Follow repeat cultures - ceftriaxone ongoing - rapidly improving symptoms -IVF x24h (1L) - increase PO intake  Type 2 diabetes mellitus with hyperglycemia, without long-term current use of insulin (HCC) -A1C of 5.4 in May of 2024  -Hold glipizide/mounjaro in patient  -Consider DC farxiga given antibiotic resistant pyelo and well controlled DM with reported A1c of 5.4    Essential hypertension Borderline hypotensive in the setting of above Hold home meds amlodipine, carvedilol, losartan  DVT prophylaxis: enoxaparin (LOVENOX) injection 40 mg Start: 02/20/23 1800   Code Status:   Code Status: Full Code  Family Communication: At bedside  Status is: INpt  Dispo: The patient is from: home              Anticipated d/c is to: home              Anticipated d/c date is: 24-48h              Patient currently NOT medically stable for discharge  Consultants:  None  Procedures:  None  Antimicrobials:  Ceftriaxone x 7-10 days pending clinical course   Subjective: No acute issue/events reported overnight.   Objective: Vitals:   02/20/23 2209 02/21/23 0029 02/21/23 0501 02/21/23 0716  BP:  101/60 (!) 119/52 (!) 122/55  Pulse:  65 80 70  Resp:  18  17   Temp: 98.1 F (36.7 C) 98.6 F (37 C) 100 F (37.8 C) 100.3 F (37.9 C)  TempSrc: Oral  Oral Oral  SpO2:  96% 98% 97%  Weight:      Height:        Intake/Output Summary (Last 24 hours) at 02/21/2023 0805 Last data filed at 02/21/2023 0512 Gross per 24 hour  Intake 639.32 ml  Output 0 ml  Net 639.32 ml   Filed Weights   02/20/23 1141 02/20/23 1538  Weight: 98 kg 94.3 kg    Examination:  General exam: Appears calm and comfortable  Respiratory system: Clear to auscultation. Respiratory effort normal. Cardiovascular system: S1 & S2 heard, RRR. No JVD, murmurs, rubs, gallops or clicks. No pedal edema. Gastrointestinal system: Abdomen is nondistended, soft and nontender. No organomegaly or masses felt. Normal bowel sounds heard. Central nervous system: Alert and oriented. No focal neurological deficits. Extremities: Symmetric 5 x 5 power. Skin: No rashes, lesions or ulcers Psychiatry: Judgement and insight appear normal. Mood & affect appropriate.   Data Reviewed: I have personally reviewed following labs and imaging studies  CBC: Recent Labs  Lab 02/20/23 1148 02/21/23 0438  WBC 12.0* 10.7*  HGB 12.0 11.3*  HCT 37.2 34.0*  MCV 90.1 90.2  PLT 200 171   Basic Metabolic Panel: Recent Labs  Lab 02/20/23 1148 02/21/23 0438  NA 134* 134*  K 4.0 4.0  CL 102 105  CO2 19* 21*  GLUCOSE  111* 127*  BUN 27* 24*  CREATININE 1.13* 1.01*  CALCIUM 8.4* 8.3*   GFR: Estimated Creatinine Clearance: 61.8 mL/min (A) (by C-G formula based on SCr of 1.01 mg/dL (H)). Liver Function Tests: No results for input(s): "AST", "ALT", "ALKPHOS", "BILITOT", "PROT", "ALBUMIN" in the last 168 hours. No results for input(s): "LIPASE", "AMYLASE" in the last 168 hours. No results for input(s): "AMMONIA" in the last 168 hours. Coagulation Profile: No results for input(s): "INR", "PROTIME" in the last 168 hours. Cardiac Enzymes: No results for input(s): "CKTOTAL", "CKMB", "CKMBINDEX",  "TROPONINI" in the last 168 hours. BNP (last 3 results) No results for input(s): "PROBNP" in the last 8760 hours. HbA1C: No results for input(s): "HGBA1C" in the last 72 hours. CBG: Recent Labs  Lab 02/20/23 1623 02/20/23 2052 02/21/23 0718  GLUCAP 107* 125* 207*   Lipid Profile: No results for input(s): "CHOL", "HDL", "LDLCALC", "TRIG", "CHOLHDL", "LDLDIRECT" in the last 72 hours. Thyroid Function Tests: No results for input(s): "TSH", "T4TOTAL", "FREET4", "T3FREE", "THYROIDAB" in the last 72 hours. Anemia Panel: No results for input(s): "VITAMINB12", "FOLATE", "FERRITIN", "TIBC", "IRON", "RETICCTPCT" in the last 72 hours. Sepsis Labs: Recent Labs  Lab 02/20/23 1516 02/20/23 2023  LATICACIDVEN 0.4* 1.1    No results found for this or any previous visit (from the past 240 hour(s)).       Radiology Studies: CT Renal Stone Study  Result Date: 02/20/2023 CLINICAL DATA:  Three day history of right flank pain associated with fevers and chills. Recent treatment for urinary tract infection EXAM: CT ABDOMEN AND PELVIS WITHOUT CONTRAST TECHNIQUE: Multidetector CT imaging of the abdomen and pelvis was performed following the standard protocol without IV contrast. RADIATION DOSE REDUCTION: This exam was performed according to the departmental dose-optimization program which includes automated exposure control, adjustment of the mA and/or kV according to patient size and/or use of iterative reconstruction technique. COMPARISON:  CT abdomen and pelvis dated 10/13/2012 FINDINGS: Lower chest: No focal consolidation or pulmonary nodule in the lung bases. No pleural effusion or pneumothorax demonstrated. Partially imaged heart size is normal. Coronary artery calcifications. Hepatobiliary: No focal hepatic lesions. No intra or extrahepatic biliary ductal dilation. Cholecystectomy. Pancreas: No focal lesions or main ductal dilation. Spleen: Normal in size without focal abnormality. Adrenals/Urinary  Tract: No adrenal nodules. Asymmetrically enlarged right kidney with perinephric stranding. No suspicious renal masses by noncontrast technique. No renal calculi or hydronephrosis. Underdistended urinary bladder with mild surrounding stranding. Stomach/Bowel: Small hiatal hernia. Normal appearance of the stomach. No evidence of bowel wall thickening, distention, or inflammatory changes. Colonic diverticulosis without acute diverticulitis. Normal appendix. Vascular/Lymphatic: Aortic atherosclerosis. No enlarged abdominal or pelvic lymph nodes. Reproductive: No adnexal masses. Other: No free fluid, fluid collection, or free air. Musculoskeletal: No acute or abnormal lytic or blastic osseous lesions. Multilevel degenerative changes of the partially imaged thoracic and lumbar spine. Small fat-containing paraumbilical hernias. IMPRESSION: 1. Asymmetrically enlarged right kidney with perinephric stranding, suspicious for pyelonephritis. 2. Underdistended urinary bladder with mild surrounding stranding, which may be related to cystitis. 3. Small hiatal hernia. 4. Small fat-containing paraumbilical hernia. 5. Aortic Atherosclerosis (ICD10-I70.0). Coronary artery calcifications. Assessment for potential risk factor modification, dietary therapy or pharmacologic therapy may be warranted, if clinically indicated. Electronically Signed   By: Agustin Cree M.D.   On: 02/20/2023 14:04        Scheduled Meds:  enoxaparin (LOVENOX) injection  40 mg Subcutaneous Q24H   FLUoxetine  40 mg Oral Daily   insulin aspart  0-9 Units Subcutaneous TID  WC   pantoprazole  80 mg Oral Q1200   Continuous Infusions:  sodium chloride 40 mL/hr at 02/20/23 1900   cefTRIAXone (ROCEPHIN)  IV 1 g (02/21/23 0742)     LOS: 0 days   Time spent:  Azucena Fallen, DO [facility]  If 7PM-7AM, please contact night-coverage [email or web address]  02/21/2023, 8:05 AM

## 2023-02-21 NOTE — Progress Notes (Signed)
New Admission Note:   Arrival Method: stretcher Mental Orientation: aa+ox4 Telemetry: N/A Assessment: Completed Skin: C/D/I IV: Pain: denies Tubes: N/A Safety Measures: Safety Fall Prevention Plan has been given, discussed and signed Admission: Completed [facility] Orientation: Patient has been orientated to the room, unit and staff.  Family:  Orders have been reviewed and implemented. Will continue to monitor the patient. Call light has been placed within reach and bed alarm has been activated.   Margarita Grizzle, RN

## 2023-02-22 ENCOUNTER — Other Ambulatory Visit: Payer: Self-pay

## 2023-02-22 DIAGNOSIS — N12 Tubulo-interstitial nephritis, not specified as acute or chronic: Secondary | ICD-10-CM | POA: Diagnosis not present

## 2023-02-22 LAB — BASIC METABOLIC PANEL
Anion gap: 4 — ABNORMAL LOW (ref 5–15)
BUN: 16 mg/dL (ref 8–23)
CO2: 23 mmol/L (ref 22–32)
Calcium: 8 mg/dL — ABNORMAL LOW (ref 8.9–10.3)
Chloride: 108 mmol/L (ref 98–111)
Creatinine, Ser: 0.87 mg/dL (ref 0.44–1.00)
GFR, Estimated: >60 mL/min (ref >60)
Glucose, Bld: 133 mg/dL — ABNORMAL HIGH (ref 70–99)
Potassium: 3.9 mmol/L (ref 3.5–5.1)
Sodium: 135 mmol/L (ref 135–145)

## 2023-02-22 LAB — CBC
HCT: 30.5 % — ABNORMAL LOW (ref 36.0–46.0)
Hemoglobin: 10.1 g/dL — ABNORMAL LOW (ref 12.0–15.0)
MCH: 29.9 pg (ref 26.0–34.0)
MCHC: 33.1 g/dL (ref 30.0–36.0)
MCV: 90.2 fL (ref 80.0–100.0)
Platelets: 172 10*3/uL (ref 150–400)
RBC: 3.38 MIL/uL — ABNORMAL LOW (ref 3.87–5.11)
RDW: 13.3 % (ref 11.5–15.5)
WBC: 7 10*3/uL (ref 4.0–10.5)
nRBC: 0 % (ref 0.0–0.2)

## 2023-02-22 LAB — GLUCOSE, CAPILLARY
Glucose-Capillary: 108 mg/dL — ABNORMAL HIGH (ref 70–99)
Glucose-Capillary: 149 mg/dL — ABNORMAL HIGH (ref 70–99)
Glucose-Capillary: 148 mg/dL — ABNORMAL HIGH (ref 70–99)

## 2023-02-22 MED ORDER — IBUPROFEN 600 MG PO TABS
600.0000 mg | ORAL_TABLET | ORAL | Status: DC | PRN
  Administered 2023-02-22: 600 mg via ORAL
  Filled 2023-02-22: qty 1

## 2023-02-22 NOTE — TOC CM/SW Note (Addendum)
Transition of Care [facility]) - Inpatient Brief Assessment   Patient Details  Name: Alicia Pineda MRN: [id number] Date of Birth: [birth year]  Transition of Care [facility]) CM/SW Contact:    Tom-Johnson, Hershal Coria, RN Phone Number: 02/22/2023, 3:59 PM   Clinical Narrative:  Patient presented to the ED with Fever, Chills,  Rt Flank Pain and Dysuria. Admitted with Sepsis 2/2 Pyelonephritis.  Patient was recently treated for UTI, completed 2 rounds of abx. Currently on IV abx.  From home sister lives with patient. Has three siblings and three supportive children. Currently employed, independent with care and drive self.  PCP is Christen Butter, NP and uses [facility] on [address]   No TOC needs or recommendations noted at this time.  Patient not Medically ready for discharge.  CM will continue to follow as patient progresses with care towards discharge.           Transition of Care Asessment: Insurance and Status: Insurance coverage has been reviewed Patient has primary care physician: Yes Home environment has been reviewed: Yes Prior level of function:: Independent Prior/Current Home Services: No current home services Social Determinants of Health Reivew: SDOH reviewed no interventions necessary Readmission risk has been reviewed: Yes Transition of care needs: no transition of care needs at this time

## 2023-02-22 NOTE — Progress Notes (Signed)
PROGRESS NOTE  [name] ZHY:[id number] DOB: [birth year] DOA: 02/20/2023 PCP: Christen Butter, NP   LOS: 1 day   Brief Narrative / Interim history: Alicia Pineda is a 67 y.o. female with medical history significant of T2DM, HTN who presented to ED with complaints of right flank pain,dysuria having been diagnosed with UTI previously now having attempted keflex as well as macrobid with ongoing/worsening symptoms.   Subjective / 24h Interval events: Complains of generalized weakness and being tired  Assesement and Plan: Principal Problem:   sepsis secondary to acute pyelonephritis Active Problems:   Type 2 diabetes mellitus with hyperglycemia, without long-term current use of insulin (HCC)   Essential hypertension   Sepsis (HCC)   Principal problem Sepsis secondary to acute pyelonephritis-initially she was on 2 antibiotics as an outpatient, Macrobid for 5 days with improvement in her symptoms however recurrence upon finishing antibiotics, followed by Keflex for 7 days again with improvement in her symptoms and recurrence once she finished antibiotics.  Imaging on admission consistent with pyelonephritis and cystitis.  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  Mild hyponatremia-resolved  Normocytic anemia-due to acute illness, no bleeding, hemoglobin stable  Scheduled Meds:  enoxaparin (LOVENOX) injection  40 mg Subcutaneous Q24H   FLUoxetine  40 mg Oral Daily   insulin aspart  0-9 Units Subcutaneous TID WC   pantoprazole  80 mg Oral Q1200   Continuous Infusions:  cefTRIAXone (ROCEPHIN)  IV 1 g (02/22/23 1044)   PRN Meds:.acetaminophen **OR** acetaminophen, HYDROcodone-acetaminophen, ibuprofen, labetalol, ondansetron **OR**  ondansetron (ZOFRAN) IV  Current Outpatient Medications  Medication Instructions   amLODipine (NORVASC) 10 mg, Oral, Daily   Blood Glucose Monitoring Suppl (ONETOUCH VERIO FLEX SYSTEM) w/Device KIT Check blood sugar three times daily.   carvedilol (COREG) 12.5 mg, Oral, 2 times daily with meals   esomeprazole (NEXIUM) 40 mg, Oral, Daily, 30 minutes before a meal   FLUoxetine (PROZAC) 40 mg, Oral, Daily   glipiZIDE (GLUCOTROL) 5 mg, Oral, 2 times daily   glucose blood (ONETOUCH VERIO) test strip Check blood sugar three times daily.   Lancets 30G MISC Check blood sugar 3 times daily   losartan (COZAAR) 100 mg, Oral, Daily   meloxicam (MOBIC) 15 mg, Oral, Daily   Mounjaro 7.5 mg, Subcutaneous, Weekly   phenazopyridine (PYRIDIUM) 100 mg, Oral, 3 times daily PRN    Diet Orders (From admission, onward)     Start     Ordered   02/20/23 1503  Diet Carb Modified Fluid consistency: Thin; Room service appropriate? Yes  Diet effective now       Question Answer Comment  Diet-HS Snack? Nothing   Calorie Level Medium 1600-2000   Fluid consistency: Thin   Room service appropriate? Yes      02/20/23 1503            DVT prophylaxis: enoxaparin (LOVENOX) injection 40 mg Start: 02/20/23 1800   Lab Results  Component Value Date   PLT 172 02/22/2023      Code Status: Full Code  Family Communication: no family at bedside   Status is: Inpatient Remains inpatient appropriate because: severity of illness  Level of care: Med-Surg  Consultants:  none  Objective: Vitals:   02/21/23 0716 02/21/23 1519 02/21/23 2124 02/22/23 0614  BP: (!) 122/55 123/60 (!) 115/54 102/74  Pulse: 70 72  71 62  Resp: 17 15 18 18   Temp: 100.3 F (37.9 C) 99.1 F (37.3 C) 98.9 F (37.2 C) 98.5 F (36.9 C)  TempSrc: Oral Oral    SpO2: 97% 98% 95% 99%  Weight:      Height:        Intake/Output Summary (Last 24 hours) at 02/22/2023 1334 Last data filed at 02/21/2023 1700 Gross per 24 hour  Intake  220 ml  Output --  Net 220 ml   Wt Readings from Last 3 Encounters:  02/20/23 94.3 kg  01/29/23 97.1 kg  08/24/22 96.2 kg    Examination:  Constitutional: NAD Eyes: no scleral icterus ENMT: Mucous membranes are moist.  Neck: normal, supple Respiratory: clear to auscultation bilaterally, no wheezing, no crackles. Normal respiratory effort. Cardiovascular: Regular rate and rhythm, no murmurs / rubs / gallops. No LE edema.  Abdomen: non distended, no tenderness. Bowel sounds positive.  Musculoskeletal: no clubbing / cyanosis.   Data Reviewed: I have independently reviewed following labs and imaging studies   CBC Recent Labs  Lab 02/20/23 1148 02/21/23 0438 02/22/23 0444  WBC 12.0* 10.7* 7.0  HGB 12.0 11.3* 10.1*  HCT 37.2 34.0* 30.5*  PLT 200 171 172  MCV 90.1 90.2 90.2  MCH 29.1 30.0 29.9  MCHC 32.3 33.2 33.1  RDW 13.2 13.3 13.3    Recent Labs  Lab 02/20/23 1148 02/20/23 1516 02/20/23 2023 02/21/23 0438 02/22/23 0444  NA 134*  --   --  134* 135  K 4.0  --   --  4.0 3.9  CL 102  --   --  105 108  CO2 19*  --   --  21* 23  GLUCOSE 111*  --   --  127* 133*  BUN 27*  --   --  24* 16  CREATININE 1.13*  --   --  1.01* 0.87  CALCIUM 8.4*  --   --  8.3* 8.0*  LATICACIDVEN  --  0.4* 1.1  --   --     ------------------------------------------------------------------------------------------------------------------ No results for input(s): "CHOL", "HDL", "LDLCALC", "TRIG", "CHOLHDL", "LDLDIRECT" in the last 72 hours.  Lab Results  Component Value Date   HGBA1C 5.4 08/24/2022   ------------------------------------------------------------------------------------------------------------------ No results for input(s): "TSH", "T4TOTAL", "T3FREE", "THYROIDAB" in the last 72 hours.  Invalid input(s): "FREET3"  Cardiac Enzymes No results for input(s): "CKMB", "TROPONINI", "MYOGLOBIN" in the last 168 hours.  Invalid input(s):  "CK" ------------------------------------------------------------------------------------------------------------------ No results found for: "BNP"  CBG: Recent Labs  Lab 02/21/23 1141 02/21/23 1555 02/21/23 2121 02/22/23 0730 02/22/23 1130  GLUCAP 139* 158* 171* 108* 149*    Recent Results (from the past 240 hour(s))  Urine Culture     Status: Abnormal (Preliminary result)   Collection Time: 02/20/23 11:44 AM   Specimen: Urine, Random  Result Value Ref Range Status   Specimen Description URINE, RANDOM  Final   Special Requests NONE Reflexed from [id number]  Final   Culture (A)  Final    >=100,000 COLONIES/mL GRAM NEGATIVE RODS IDENTIFICATION AND SUSCEPTIBILITIES TO FOLLOW Performed at [facility], [address], [place], [place] [address]    Report Status PENDING  Incomplete  Culture, blood (Routine X 2) w Reflex to ID Panel     Status: None (Preliminary result)   Collection Time: 02/20/23  4:09 PM   Specimen: BLOOD RIGHT ARM  Result Value Ref Range Status   Specimen Description BLOOD RIGHT ARM  Final   Special Requests   Final  BOTTLES DRAWN AEROBIC AND ANAEROBIC Blood Culture results may not be optimal due to an excessive volume of blood received in culture bottles   Culture   Final    NO GROWTH 2 DAYS Performed at [facility], [address] [place], [place] [address]    Report Status PENDING  Incomplete  Culture, blood (Routine X 2) w Reflex to ID Panel     Status: None (Preliminary result)   Collection Time: 02/20/23  4:16 PM   Specimen: BLOOD LEFT ARM  Result Value Ref Range Status   Specimen Description BLOOD LEFT ARM  Final   Special Requests   Final    BOTTLES DRAWN AEROBIC AND ANAEROBIC Blood Culture results may not be optimal due to an excessive volume of blood received in culture bottles   Culture   Final    NO GROWTH 2 DAYS Performed at [facility], [address] [place], [place] [address]    Report Status PENDING  Incomplete      Radiology Studies: No results found.   Pamella Pert, MD, PhD [facility]  Between 7 am - 7 pm I am available, please contact me via Amion (for emergencies) or Securechat (non urgent messages)  Between 7 pm - 7 am I am not available, please contact night coverage MD/APP via Amion

## 2023-02-22 NOTE — Plan of Care (Signed)
  Problem: Education: Goal: Knowledge of General Education information will improve Description Including pain rating scale, medication(s)/side effects and non-pharmacologic comfort measures Outcome: Progressing   

## 2023-02-22 NOTE — Plan of Care (Signed)

## 2023-02-23 ENCOUNTER — Other Ambulatory Visit (HOSPITAL_COMMUNITY): Payer: Self-pay

## 2023-02-23 DIAGNOSIS — N12 Tubulo-interstitial nephritis, not specified as acute or chronic: Secondary | ICD-10-CM | POA: Diagnosis not present

## 2023-02-23 LAB — CBC
HCT: 29 % — ABNORMAL LOW (ref 36.0–46.0)
Hemoglobin: 9.7 g/dL — ABNORMAL LOW (ref 12.0–15.0)
MCH: 29.6 pg (ref 26.0–34.0)
MCHC: 33.4 g/dL (ref 30.0–36.0)
MCV: 88.4 fL (ref 80.0–100.0)
Platelets: 168 10*3/uL (ref 150–400)
RBC: 3.28 MIL/uL — ABNORMAL LOW (ref 3.87–5.11)
RDW: 13.6 % (ref 11.5–15.5)
WBC: 5.9 10*3/uL (ref 4.0–10.5)
nRBC: 0 % (ref 0.0–0.2)

## 2023-02-23 LAB — URINE CULTURE: Culture: >=100000 — AB

## 2023-02-23 LAB — BASIC METABOLIC PANEL
Anion gap: 6 (ref 5–15)
BUN: 19 mg/dL (ref 8–23)
CO2: 22 mmol/L (ref 22–32)
Calcium: 8.2 mg/dL — ABNORMAL LOW (ref 8.9–10.3)
Chloride: 107 mmol/L (ref 98–111)
Creatinine, Ser: 0.72 mg/dL (ref 0.44–1.00)
GFR, Estimated: >60 mL/min (ref >60)
Glucose, Bld: 162 mg/dL — ABNORMAL HIGH (ref 70–99)
Potassium: 4.1 mmol/L (ref 3.5–5.1)
Sodium: 135 mmol/L (ref 135–145)

## 2023-02-23 LAB — GLUCOSE, CAPILLARY: Glucose-Capillary: 111 mg/dL — ABNORMAL HIGH (ref 70–99)

## 2023-02-23 MED ORDER — AMLODIPINE BESYLATE 10 MG PO TABS
10.0000 mg | ORAL_TABLET | Freq: Every day | ORAL | Status: DC
  Administered 2023-02-23: 10 mg via ORAL
  Filled 2023-02-23: qty 1

## 2023-02-23 MED ORDER — CEFADROXIL 500 MG PO CAPS
500.0000 mg | ORAL_CAPSULE | Freq: Two times a day (BID) | ORAL | 0 refills | Status: AC
  Filled 2023-02-23: qty 20, 10d supply, fill #0

## 2023-02-23 NOTE — Discharge Summary (Signed)
Physician Discharge Summary  Alicia Pineda WUJ:[id number] DOB: December 25, 1955 DOA: 02/20/2023  PCP: Christen Butter, NP  Admit date: 02/20/2023 Discharge date: 02/23/2023  Admitted From: home Disposition:  home   Recommendations for Outpatient Follow-up:  Follow up with PCP in 1-2 weeks  Home Health: none Equipment/Devices: none  Discharge Condition: stable CODE STATUS: Full code Diet Orders (From admission, onward)     Start     Ordered   02/20/23 1503  Diet Carb Modified Fluid consistency: Thin; Room service appropriate? Yes  Diet effective now       Question Answer Comment  Diet-HS Snack? Nothing   Calorie Level Medium 1600-2000   Fluid consistency: Thin   Room service appropriate? Yes      02/20/23 1503            HPI: Per admitting MD, Alicia Pineda is a 67 y.o. female with medical history significant of T2DM, HTN who presented to Ed with complaints of right flank pain,dysuria. She had a UTI a few weeks ago and was given a course of macrobid. As soon as it was over she had urgency, dysuria again and then was put on course of keflex on 10/25. Urine culture was sensitive to both meds.  After she finished keflex still had a twinge of dysuria. Wednesday this week she started to have chills, fever, body aches, right sided CVA tenderness and vomiting. . She thought this was a 24 bug and felt a little better. She was able to keep food down. On Friday the fever/chills, right sided CVA tenderness returned and she came to ED today. Tmax of 103.8. She has supra pubic pain, dysuria, incontinence, urgency and frequency. Denies vision changes, chest pain or palpitations, shortness of breath or cough, diarrhea, vomiting or leg swelling.  She does not smoke or drink alcohol.   Hospital Course / Discharge diagnoses: Principal Problem:   sepsis secondary to acute pyelonephritis Active Problems:   Type 2 diabetes mellitus with hyperglycemia, without long-term current use of insulin (HCC)    Essential hypertension   Sepsis (HCC)   Principal problem Sepsis secondary to acute pyelonephritis-initially she was on 2 antibiotics as an outpatient, Macrobid for 5 days with improvement in her symptoms however recurrence upon finishing antibiotics, followed by Keflex for 7 days again with improvement in her symptoms and recurrence once she finished antibiotics.  Imaging on admission consistent with pyelonephritis and cystitis.  Patient was admitted to the hospital and placed on ceftriaxone with improvement in her condition.  She is now afebrile, white count is normal, and urine cultures speciated E. coli resistant to ampicillin, Bactrim and Augmentin but otherwise sensitive.  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   carvedilol 12.5 MG tablet Commonly known as: COREG Take 1 tablet (12.5 mg total) by mouth 2 (two) times daily with a meal.   cefadroxil 500 MG capsule Commonly known as: DURICEF Take 1 capsule (500 mg total) by mouth  2 (two) times daily for 10 days.   esomeprazole 40 MG capsule Commonly known as: NexIUM Take 1 capsule (40 mg total) by mouth daily. 30 minutes before a meal   FLUoxetine 40 MG capsule Commonly known as: PROZAC Take 1 capsule (40 mg total) by mouth daily.   glipiZIDE 5 MG tablet Commonly known as: GLUCOTROL Take 1 tablet (5 mg total) by mouth 2 (two) times daily.   losartan 100 MG  tablet Commonly known as: COZAAR Take 1 tablet (100 mg total) by mouth daily.   meloxicam 15 MG tablet Commonly known as: MOBIC Take 1 tablet (15 mg total) by mouth daily.   Mounjaro 7.5 MG/0.5ML Pen Generic drug: tirzepatide Inject 7.5 mg into the skin once a week. What changed: additional instructions   OneTouch Delica Plus Lancet33G Misc Check blood sugar 3 times daily   OneTouch Verio Flex System w/Device Kit Check blood sugar three times daily.   OneTouch Verio test strip Generic drug: glucose blood Check blood sugar three times daily.   phenazopyridine 100 MG tablet Commonly known as: PYRIDIUM Take 1 tablet (100 mg total) by mouth 3 (three) times daily as needed for pain.        Follow-up Information     Christen Butter, NP Follow up in 1 week(s).   Specialty: Nurse Practitioner Contact information: [address] [place] [address] [phone]                 Consultations: none  Procedures/Studies:  CT Renal Stone Study  Result Date: 02/20/2023 CLINICAL DATA:  Three day history of right flank pain associated with fevers and chills. Recent treatment for urinary tract infection EXAM: CT ABDOMEN AND PELVIS WITHOUT CONTRAST TECHNIQUE: Multidetector CT imaging of the abdomen and pelvis was performed following the standard protocol without IV contrast. RADIATION DOSE REDUCTION: This exam was performed according to the departmental dose-optimization program which includes automated exposure control, adjustment of the mA and/or kV according to patient size and/or use of iterative reconstruction technique. COMPARISON:  CT abdomen and pelvis dated 10/13/2012 FINDINGS: Lower chest: No focal consolidation or pulmonary nodule in the lung bases. No pleural effusion or pneumothorax demonstrated. Partially imaged heart size is normal. Coronary artery calcifications. Hepatobiliary: No focal hepatic lesions. No intra or extrahepatic biliary ductal  dilation. Cholecystectomy. Pancreas: No focal lesions or main ductal dilation. Spleen: Normal in size without focal abnormality. Adrenals/Urinary Tract: No adrenal nodules. Asymmetrically enlarged right kidney with perinephric stranding. No suspicious renal masses by noncontrast technique. No renal calculi or hydronephrosis. Underdistended urinary bladder with mild surrounding stranding. Stomach/Bowel: Small hiatal hernia. Normal appearance of the stomach. No evidence of bowel wall thickening, distention, or inflammatory changes. Colonic diverticulosis without acute diverticulitis. Normal appendix. Vascular/Lymphatic: Aortic atherosclerosis. No enlarged abdominal or pelvic lymph nodes. Reproductive: No adnexal masses. Other: No free fluid, fluid collection, or free air. Musculoskeletal: No acute or abnormal lytic or blastic osseous lesions. Multilevel degenerative changes of the partially imaged thoracic and lumbar spine. Small fat-containing paraumbilical hernias. IMPRESSION: 1. Asymmetrically enlarged right kidney with perinephric stranding, suspicious for pyelonephritis. 2. Underdistended urinary bladder with mild surrounding stranding, which may be related to cystitis. 3. Small hiatal hernia. 4. Small fat-containing paraumbilical hernia. 5. Aortic Atherosclerosis (ICD10-I70.0). Coronary artery calcifications. Assessment for potential risk factor modification, dietary therapy or pharmacologic therapy may be warranted, if clinically indicated. Electronically Signed   By: Agustin Cree M.D.   On: 02/20/2023 14:04     Subjective: - no  chest pain, shortness of breath, no abdominal pain, nausea or vomiting.   Discharge Exam: BP (!) 140/67 (BP Location: Right Arm)   Pulse 68   Temp 99 F (37.2 C)   Resp 17   Ht 5\' 4"  (1.626 m)   Wt 94.3 kg   SpO2 98%   BMI 35.70 kg/m   General: Pt is alert, awake, not in acute distress Cardiovascular: RRR, S1/S2 +, no rubs, no gallops Respiratory: CTA bilaterally, no  wheezing, no rhonchi Abdominal: Soft, NT, ND, bowel sounds + Extremities: no edema, no cyanosis    The results of significant diagnostics from this hospitalization (including imaging, microbiology, ancillary and laboratory) are listed below for reference.     Microbiology: Recent Results (from the past 240 hour(s))  Urine Culture     Status: Abnormal   Collection Time: 02/20/23 11:44 AM   Specimen: Urine, Random  Result Value Ref Range Status   Specimen Description URINE, RANDOM  Final   Special Requests   Final    NONE Reflexed from [id number] Performed at [facility], [address], [place], [place] [address]    Culture >=100,000 COLONIES/mL ESCHERICHIA COLI (A)  Final   Report Status 02/23/2023 FINAL  Final   Organism ID, Bacteria ESCHERICHIA COLI (A)  Final      Susceptibility   Escherichia coli - MIC*    AMPICILLIN >=32 RESISTANT Resistant     CEFAZOLIN <=4 SENSITIVE Sensitive     CEFEPIME <=0.12 SENSITIVE Sensitive     CEFTRIAXONE <=0.25 SENSITIVE Sensitive     CIPROFLOXACIN <=0.25 SENSITIVE Sensitive     GENTAMICIN <=1 SENSITIVE Sensitive     IMIPENEM <=0.25 SENSITIVE Sensitive     NITROFURANTOIN <=16 SENSITIVE Sensitive     TRIMETH/SULFA >=320 RESISTANT Resistant     AMPICILLIN/SULBACTAM >=32 RESISTANT Resistant     PIP/TAZO 8 SENSITIVE Sensitive ug/mL    * >=100,000 COLONIES/mL ESCHERICHIA COLI  Culture, blood (Routine X 2) w Reflex to ID Panel     Status: None (Preliminary result)   Collection Time: 02/20/23  4:09 PM   Specimen: BLOOD RIGHT ARM  Result Value Ref Range Status   Specimen Description BLOOD RIGHT ARM  Final   Special Requests   Final    BOTTLES DRAWN AEROBIC AND ANAEROBIC Blood Culture results may not be optimal due to an excessive volume of blood received in culture bottles   Culture   Final    NO GROWTH 3 DAYS Performed at [facility], [address] [place], [place] [address]    Report Status PENDING  Incomplete  Culture, blood  (Routine X 2) w Reflex to ID Panel     Status: None (Preliminary result)   Collection Time: 02/20/23  4:16 PM   Specimen: BLOOD LEFT ARM  Result Value Ref Range Status   Specimen Description BLOOD LEFT ARM  Final   Special Requests   Final    BOTTLES DRAWN AEROBIC AND ANAEROBIC Blood Culture results may not be optimal due to an excessive volume of blood received in culture bottles   Culture   Final    NO GROWTH 3 DAYS Performed at [facility], [address], [place], [place] [address]    Report Status PENDING  Incomplete     Labs: Basic Metabolic Panel: Recent Labs  Lab 02/20/23 1148 02/21/23 0438 02/22/23 0444 02/23/23 0427  NA 134* 134* 135 135  K 4.0 4.0 3.9 4.1  CL 102 105 108 107  CO2 19* 21* 23 22  GLUCOSE 111* 127* 133* 162*  BUN 27* 24* 16 19  CREATININE 1.13* 1.01* 0.87 0.72  CALCIUM 8.4* 8.3* 8.0* 8.2*   Liver Function Tests: No results for input(s): "AST", "ALT", "ALKPHOS", "BILITOT", "PROT", "ALBUMIN" in the last 168 hours. CBC: Recent Labs  Lab 02/20/23 1148 02/21/23 0438 02/22/23 0444 02/23/23 0427  WBC 12.0* 10.7* 7.0 5.9  HGB 12.0 11.3* 10.1* 9.7*  HCT 37.2 34.0* 30.5* 29.0*  MCV 90.1 90.2 90.2 88.4  PLT 200 171 172 168   CBG: Recent Labs  Lab 02/21/23 2121 02/22/23 0730 02/22/23 1130 02/22/23 1638 02/23/23 0749  GLUCAP 171* 108* 149* 148* 111*   Hgb A1c No results for input(s): "HGBA1C" in the last 72 hours. Lipid Profile No results for input(s): "CHOL", "HDL", "LDLCALC", "TRIG", "CHOLHDL", "LDLDIRECT" in the last 72 hours. Thyroid function studies No results for input(s): "TSH", "T4TOTAL", "T3FREE", "THYROIDAB" in the last 72 hours.  Invalid input(s): "FREET3" Urinalysis    Component Value Date/Time   COLORURINE YELLOW 02/20/2023 1144   APPEARANCEUR CLOUDY (A) 02/20/2023 1144   LABSPEC 1.023 02/20/2023 1144   PHURINE 5.0 02/20/2023 1144   GLUCOSEU >=500 (A) 02/20/2023 1144   HGBUR SMALL (A) 02/20/2023 1144    BILIRUBINUR NEGATIVE 02/20/2023 1144   BILIRUBINUR negative 01/29/2023 0954   KETONESUR 20 (A) 02/20/2023 1144   PROTEINUR 100 (A) 02/20/2023 1144   UROBILINOGEN 1.0 01/29/2023 0954   NITRITE POSITIVE (A) 02/20/2023 1144   LEUKOCYTESUR LARGE (A) 02/20/2023 1144    FURTHER DISCHARGE INSTRUCTIONS:   Get Medicines reviewed and adjusted: Please take all your medications with you for your next visit with your Primary MD   Laboratory/radiological data: Please request your Primary MD to go over all hospital tests and procedure/radiological results at the follow up, please ask your Primary MD to get all Hospital records sent to his/her office.   In some cases, they will be blood work, cultures and biopsy results pending at the time of your discharge. Please request that your primary care M.D. goes through all the records of your hospital data and follows up on these results.   Also Note the following: If you experience worsening of your admission symptoms, develop shortness of breath, life threatening emergency, suicidal or homicidal thoughts you must seek medical attention immediately by calling 911 or calling your MD immediately  if symptoms less severe.   You must read complete instructions/literature along with all the possible adverse reactions/side effects for all the Medicines you take and that have been prescribed to you. Take any new Medicines after you have completely understood and accpet all the possible adverse reactions/side effects.    Do not drive when taking Pain medications or sleeping medications (Benzodaizepines)   Do not take more than prescribed Pain, Sleep and Anxiety Medications. It is not advisable to combine anxiety,sleep and pain medications without talking with your primary care practitioner   Special Instructions: If you have smoked or chewed Tobacco  in the last 2 yrs please stop smoking, stop any regular Alcohol  and or any Recreational drug use.   Wear Seat belts  while driving.   Please note: You were cared for by a hospitalist during your hospital stay. Once you are discharged, your primary care physician will handle any further medical issues. Please note that NO REFILLS for any discharge medications will be authorized once you are discharged, as it is imperative that you return to your primary care physician (or establish a relationship  with a primary care physician if you do not have one) for your post hospital discharge needs so that they can reassess your need for medications and monitor your lab values.  Time coordinating discharge: 20 minutes  SIGNED:  Pamella Pert, MD, PhD 02/23/2023, 9:12 AM

## 2023-02-23 NOTE — TOC Transition Note (Signed)
Transition of Care [facility]) - CM/SW Discharge Note   Patient Details  Name: Alicia Pineda MRN: [id number] Date of Birth: 11/28/1955  [facility] of [facility]) CM/SW Contact:  Tom-Johnson, Hershal Coria, RN Phone Number: 02/23/2023, 10:44 AM   Clinical Narrative:     Patient is scheduled for discharge today.  Readmission Risk Assessment done. Outpatient f/u, hospital f/u and discharge instructions on AVS. Prescriptions sent to [facility] pharmacy and meds will be delivered to patient at bedside prior discharge. No TOC needs or recommendations noted. Daughter in-law to transport at discharge.  No further TOC needs noted.             Final next level of care: Home/Self Care Barriers to Discharge: Barriers Resolved   Patient Goals and CMS Choice CMS Medicare.gov Compare Post Acute Care list provided to:: Patient Choice offered to / list presented to : NA  Discharge Placement                  Patient to be transferred to facility by: Daughter in-law Name of family member notified: [facility]    Discharge Plan and Services Additional resources added to the After Visit Summary for                  DME Arranged: N/A DME Agency: NA       HH Arranged: NA HH Agency: NA        Social Determinants of Health (SDOH) Interventions SDOH Screenings   Food Insecurity: No Food Insecurity (02/20/2023)  Housing: Low Risk  (02/20/2023)  Transportation Needs: No Transportation Needs (02/20/2023)  Utilities: Not At Risk (02/20/2023)  Depression (PHQ2-9): Low Risk  (05/12/2022)  Financial Resource Strain: Low Risk  (08/21/2022)  Physical Activity: Insufficiently Active (08/21/2022)  Social Connections: Moderately Integrated (08/21/2022)  Stress: No Stress Concern Present (08/21/2022)  Tobacco Use: Low Risk  (02/20/2023)     Readmission Risk Interventions    02/22/2023    3:58 PM  Readmission Risk Prevention Plan  Post Dischage Appt Complete  Medication Screening Complete   Transportation Screening Complete

## 2023-02-23 NOTE — Plan of Care (Signed)

## 2023-02-24 ENCOUNTER — Encounter: Payer: Self-pay | Admitting: Medical-Surgical

## 2023-02-24 ENCOUNTER — Ambulatory Visit: Payer: Commercial Managed Care - PPO | Admitting: Medical-Surgical

## 2023-02-24 VITALS — BP 137/71 | HR 58 | Resp 20 | Ht 64.0 in | Wt 210.1 lb

## 2023-02-24 DIAGNOSIS — K130 Diseases of lips: Secondary | ICD-10-CM | POA: Diagnosis not present

## 2023-02-24 DIAGNOSIS — Z7985 Long-term (current) use of injectable non-insulin antidiabetic drugs: Secondary | ICD-10-CM | POA: Diagnosis not present

## 2023-02-24 DIAGNOSIS — R202 Paresthesia of skin: Secondary | ICD-10-CM

## 2023-02-24 DIAGNOSIS — Z09 Encounter for follow-up examination after completed treatment for conditions other than malignant neoplasm: Secondary | ICD-10-CM | POA: Diagnosis not present

## 2023-02-24 DIAGNOSIS — E1165 Type 2 diabetes mellitus with hyperglycemia: Secondary | ICD-10-CM

## 2023-02-24 LAB — POCT GLYCOSYLATED HEMOGLOBIN (HGB A1C): Hemoglobin A1C: 5.3 % (ref 4.0–5.6)

## 2023-02-24 LAB — POCT UA - MICROALBUMIN
Albumin/Creatinine Ratio, Urine, POC: >300
Creatinine, POC: 50 mg/dL
Microalbumin Ur, POC: 150 mg/L

## 2023-02-24 MED ORDER — MUPIROCIN 2 % EX OINT: TOPICAL_OINTMENT | CUTANEOUS | 3 refills | Status: AC

## 2023-02-24 NOTE — Progress Notes (Signed)
        Established patient visit  History, exam, impression, and plan:  1. Hospital discharge follow-up Very pleasant 67 year old female presenting today for hospital discharge follow-up.  She was evaluated and admitted on 02/20/2023 due to pyelonephritis with sepsis.  While inpatient, she was treated with IV antibiotics and finally discharged on 02/23/2023.  She was sent home with a 10-day course of Duricef which she is taking as prescribed and tolerating well.  Was taken off of her blood pressure medications while she was in the hospital and told to restart slowly.  She was not sure what this meant but has been taking amlodipine 10 mg daily for the last 2 days.  Reviewed labs from her hospitalization and it showed that she had some lingering anemia and reduced kidney function.  Will plan to recheck these in 1-2 weeks.  Discussed restarting her hypertensive medications very slowly and monitoring blood pressure closely at home.  Would like her to monitor blood pressure for with a goal of 130/80 or less.  If consistently higher, add back losartan at half dose.  If still running high, okay to increase losartan to the full dose.  Patient verbalized understanding.  Lab orders placed so she can collect these before her next appointment.  No other needs or concerns identified today. - CBC with Differential/Platelet - CMP14+EGFR  2. Type 2 diabetes mellitus with hyperglycemia, without long-term current use of insulin (HCC) POCT hemoglobin A1c at 5.3% today.  She is currently taking Mounjaro 7.5 mg daily and glipizide 5 mg twice daily.  She was placed on Farxiga however they suspect that this medication is the reason for the recurrent UTI and subsequent pyelonephritis.  She was discharged with instructions to stop the [place].  Advised to monitor sugars at home for fasting goal of 90-120.  POCT microalbumin showing high abnormal but suspect this is due to recent pyelonephritis and sepsis.  Plan to recheck this  in about 3 months.  If this remains high and she is already on an ARB, we may need to add [place].  For now, continue glipizide and metformin as prescribed. - POCT UA - Microalbumin - POCT HgB A1C  3. Paresthesia Has had some issues with upper and lower extremity paresthesias that extend back to prior to her hospitalization.  Also having difficulty with dry skin, oral lesions, and profound fatigue.  Interested in having her vitamin B12 checked.  Ordering today. - Vitamin B12  4. Lesion of lip Has several lesions on her lips that seem to be taking their time healing.  She has had some drainage from one on the lower lip.  Has not been treating them with any topical or oral medication.  She does have a history of cold sores but these have appeared differently.  Adding mupirocin 3 times daily x 7 days to cover for impetigo/MRSA. - mupirocin ointment (BACTROBAN) 2 %; Apply to affected area TID for 7 days.  Dispense: 30 g; Refill: 3  Procedures performed this visit: None.  Return in about 2 weeks (around 03/10/2023) for DM/UTI follow up.  __________________________________ Thayer Ohm, DNP, APRN, FNP-BC Primary Care and Sports Medicine [facility] [place]

## 2023-02-25 LAB — CULTURE, BLOOD (ROUTINE X 2)
Culture: NO GROWTH
Culture: NO GROWTH

## 2023-03-08 ENCOUNTER — Other Ambulatory Visit: Payer: Self-pay

## 2023-03-08 ENCOUNTER — Other Ambulatory Visit: Payer: Self-pay | Admitting: Medical-Surgical

## 2023-03-08 DIAGNOSIS — Z09 Encounter for follow-up examination after completed treatment for conditions other than malignant neoplasm: Secondary | ICD-10-CM | POA: Diagnosis not present

## 2023-03-08 DIAGNOSIS — R202 Paresthesia of skin: Secondary | ICD-10-CM | POA: Diagnosis not present

## 2023-03-09 LAB — CMP14+EGFR
ALT: 14 [IU]/L (ref 0–32)
AST: 16 [IU]/L (ref 0–40)
Albumin: 3.6 g/dL — ABNORMAL LOW (ref 3.9–4.9)
Alkaline Phosphatase: 170 [IU]/L — ABNORMAL HIGH (ref 44–121)
BUN/Creatinine Ratio: 51 — ABNORMAL HIGH (ref 12–28)
BUN: 43 mg/dL — ABNORMAL HIGH (ref 8–27)
Bilirubin Total: <0.2 mg/dL (ref 0.0–1.2)
CO2: 21 mmol/L (ref 20–29)
Calcium: 9 mg/dL (ref 8.7–10.3)
Chloride: 105 mmol/L (ref 96–106)
Creatinine, Ser: 0.85 mg/dL (ref 0.57–1.00)
Globulin, Total: 2.6 g/dL (ref 1.5–4.5)
Glucose: 128 mg/dL — ABNORMAL HIGH (ref 70–99)
Potassium: 4.5 mmol/L (ref 3.5–5.2)
Sodium: 140 mmol/L (ref 134–144)
Total Protein: 6.2 g/dL (ref 6.0–8.5)
eGFR: 76 mL/min/{1.73_m2} (ref >59)

## 2023-03-09 LAB — VITAMIN B12: Vitamin B-12: 826 pg/mL (ref 232–1245)

## 2023-03-09 LAB — CBC WITH DIFFERENTIAL/PLATELET
Basophils Absolute: 0 10*3/uL (ref 0.0–0.2)
Basos: 1 %
EOS (ABSOLUTE): 0.3 10*3/uL (ref 0.0–0.4)
Eos: 4 %
Hematocrit: 33.4 % — ABNORMAL LOW (ref 34.0–46.6)
Hemoglobin: 10.8 g/dL — ABNORMAL LOW (ref 11.1–15.9)
Immature Grans (Abs): 0 10*3/uL (ref 0.0–0.1)
Immature Granulocytes: 0 %
Lymphocytes Absolute: 2.2 10*3/uL (ref 0.7–3.1)
Lymphs: 32 %
MCH: 30.1 pg (ref 26.6–33.0)
MCHC: 32.3 g/dL (ref 31.5–35.7)
MCV: 93 fL (ref 79–97)
Monocytes Absolute: 0.4 10*3/uL (ref 0.1–0.9)
Monocytes: 6 %
Neutrophils Absolute: 4 10*3/uL (ref 1.4–7.0)
Neutrophils: 57 %
Platelets: 351 10*3/uL (ref 150–450)
RBC: 3.59 x10E6/uL — ABNORMAL LOW (ref 3.77–5.28)
RDW: 13.5 % (ref 11.7–15.4)
WBC: 7 10*3/uL (ref 3.4–10.8)

## 2023-03-10 ENCOUNTER — Other Ambulatory Visit: Payer: Self-pay | Admitting: Medical-Surgical

## 2023-03-10 ENCOUNTER — Other Ambulatory Visit: Payer: Self-pay

## 2023-03-10 DIAGNOSIS — I10 Essential (primary) hypertension: Secondary | ICD-10-CM

## 2023-03-10 DIAGNOSIS — Z1231 Encounter for screening mammogram for malignant neoplasm of breast: Secondary | ICD-10-CM

## 2023-03-15 ENCOUNTER — Ambulatory Visit: Payer: Commercial Managed Care - PPO | Admitting: Medical-Surgical

## 2023-03-15 ENCOUNTER — Other Ambulatory Visit: Payer: Self-pay

## 2023-03-15 MED ORDER — GLIPIZIDE 5 MG PO TABS
5.0000 mg | ORAL_TABLET | Freq: Two times a day (BID) | ORAL | 1 refills | Status: DC
  Filled 2023-03-15 – 2023-04-24 (×2): qty 180, 90d supply, fill #0
  Filled 2023-07-22: qty 180, 90d supply, fill #1

## 2023-03-15 MED ORDER — ESOMEPRAZOLE MAGNESIUM 40 MG PO CPDR
40.0000 mg | DELAYED_RELEASE_CAPSULE | Freq: Every day | ORAL | 0 refills | Status: DC
  Filled 2023-03-15: qty 30, 30d supply, fill #0

## 2023-03-15 MED ORDER — LOSARTAN POTASSIUM 100 MG PO TABS
100.0000 mg | ORAL_TABLET | Freq: Every day | ORAL | 1 refills | Status: DC
Start: 2023-03-15 — End: 2023-08-20
  Filled 2023-03-15 – 2023-05-08 (×2): qty 90, 90d supply, fill #0
  Filled 2023-08-04: qty 90, 90d supply, fill #1

## 2023-03-15 MED ORDER — CARVEDILOL 12.5 MG PO TABS
12.5000 mg | ORAL_TABLET | Freq: Two times a day (BID) | ORAL | 1 refills | Status: DC
  Filled 2023-03-15 – 2023-05-08 (×2): qty 180, 90d supply, fill #0
  Filled 2023-08-04: qty 180, 90d supply, fill #1

## 2023-03-15 MED ORDER — MELOXICAM 15 MG PO TABS
15.0000 mg | ORAL_TABLET | Freq: Every day | ORAL | 0 refills | Status: DC
  Filled 2023-03-15: qty 30, 30d supply, fill #0

## 2023-03-15 MED ORDER — AMLODIPINE BESYLATE 10 MG PO TABS
10.0000 mg | ORAL_TABLET | Freq: Every day | ORAL | 1 refills | Status: DC
  Filled 2023-03-15 – 2023-04-08 (×2): qty 90, 90d supply, fill #0
  Filled 2023-07-07: qty 90, 90d supply, fill #1

## 2023-03-17 ENCOUNTER — Other Ambulatory Visit: Payer: Self-pay

## 2023-03-18 ENCOUNTER — Other Ambulatory Visit: Payer: Self-pay

## 2023-03-29 ENCOUNTER — Ambulatory Visit: Payer: Commercial Managed Care - PPO | Admitting: Medical-Surgical

## 2023-04-05 ENCOUNTER — Other Ambulatory Visit (HOSPITAL_COMMUNITY): Payer: Self-pay

## 2023-04-08 ENCOUNTER — Other Ambulatory Visit (HOSPITAL_BASED_OUTPATIENT_CLINIC_OR_DEPARTMENT_OTHER): Payer: Self-pay

## 2023-04-10 ENCOUNTER — Other Ambulatory Visit: Payer: Self-pay | Admitting: Medical-Surgical

## 2023-04-12 ENCOUNTER — Other Ambulatory Visit (HOSPITAL_COMMUNITY): Payer: Self-pay

## 2023-04-12 ENCOUNTER — Other Ambulatory Visit: Payer: Self-pay

## 2023-04-12 MED ORDER — MELOXICAM 15 MG PO TABS
15.0000 mg | ORAL_TABLET | Freq: Every day | ORAL | 0 refills | Status: DC
  Filled 2023-04-12: qty 30, 30d supply, fill #0

## 2023-04-12 MED ORDER — ESOMEPRAZOLE MAGNESIUM 40 MG PO CPDR
40.0000 mg | DELAYED_RELEASE_CAPSULE | Freq: Every day | ORAL | 0 refills | Status: DC
  Filled 2023-04-12: qty 30, 30d supply, fill #0

## 2023-04-13 ENCOUNTER — Other Ambulatory Visit (HOSPITAL_COMMUNITY): Payer: Self-pay

## 2023-04-13 ENCOUNTER — Other Ambulatory Visit: Payer: Self-pay

## 2023-04-19 ENCOUNTER — Encounter: Payer: Self-pay | Admitting: Medical-Surgical

## 2023-04-22 ENCOUNTER — Other Ambulatory Visit: Payer: Self-pay | Admitting: Medical-Surgical

## 2023-04-22 ENCOUNTER — Ambulatory Visit (INDEPENDENT_AMBULATORY_CARE_PROVIDER_SITE_OTHER): Payer: Commercial Managed Care - PPO | Admitting: Medical-Surgical

## 2023-04-22 ENCOUNTER — Encounter: Payer: Self-pay | Admitting: Medical-Surgical

## 2023-04-22 VITALS — BP 136/64 | HR 61 | Resp 20 | Ht 64.0 in | Wt 217.8 lb

## 2023-04-22 DIAGNOSIS — Z1231 Encounter for screening mammogram for malignant neoplasm of breast: Secondary | ICD-10-CM | POA: Diagnosis not present

## 2023-04-22 DIAGNOSIS — Z0289 Encounter for other administrative examinations: Secondary | ICD-10-CM

## 2023-04-22 DIAGNOSIS — Z780 Asymptomatic menopausal state: Secondary | ICD-10-CM | POA: Diagnosis not present

## 2023-04-22 NOTE — Progress Notes (Signed)
        Established patient visit  History, exam, impression, and plan:  1. Encounter for screening mammogram for malignant neoplasm of breast (Primary) Pleasant 68 year old female presenting today for follow-up.  She is due for a mammogram.  Discussed recommendations.  Order placed today.  2. Postmenopausal She is also due for a bone density scan now that she is 67.  Ordering today. - DG Bone Density; Future  3. Encounter for completion of form with patient Has paperwork with her for FMLA to cover the time she was hospitalized with sepsis due to pyelonephritis.  She tried to get the hospitalist to fill it out however did not get any response back.  Her job is now requiring it to be completed or they are going to deny her request for FMLA.  Reviewed the paperwork, discussed time missed from work, and completed the form today.  The originals were given to the patient and copies were made for upload into her medical file.  Procedures performed this visit: None.  Return in about 4 months (around 08/20/2023) for DM follow up.  __________________________________ Thayer Ohm, DNP, APRN, FNP-BC Primary Care and Sports Medicine [facility]

## 2023-04-24 ENCOUNTER — Other Ambulatory Visit (HOSPITAL_COMMUNITY): Payer: Self-pay

## 2023-04-25 ENCOUNTER — Other Ambulatory Visit: Payer: Self-pay

## 2023-05-08 ENCOUNTER — Other Ambulatory Visit (HOSPITAL_COMMUNITY): Payer: Self-pay

## 2023-05-08 ENCOUNTER — Encounter: Payer: Self-pay | Admitting: Medical-Surgical

## 2023-05-09 ENCOUNTER — Other Ambulatory Visit: Payer: Self-pay | Admitting: Medical-Surgical

## 2023-05-10 ENCOUNTER — Other Ambulatory Visit (HOSPITAL_COMMUNITY): Payer: Self-pay

## 2023-05-10 MED ORDER — MOUNJARO 7.5 MG/0.5ML ~~LOC~~ SOAJ
7.5000 mg | SUBCUTANEOUS | 0 refills | Status: DC
  Filled 2023-05-10: qty 6, 84d supply, fill #0

## 2023-05-10 MED ORDER — FLUOXETINE HCL 40 MG PO CAPS
40.0000 mg | ORAL_CAPSULE | Freq: Every day | ORAL | 0 refills | Status: DC
  Filled 2023-05-10: qty 90, 90d supply, fill #0

## 2023-05-10 MED ORDER — ESOMEPRAZOLE MAGNESIUM 40 MG PO CPDR
40.0000 mg | DELAYED_RELEASE_CAPSULE | Freq: Every day | ORAL | 1 refills | Status: DC
  Filled 2023-05-10: qty 30, 30d supply, fill #0
  Filled 2023-06-11: qty 30, 30d supply, fill #1

## 2023-05-11 ENCOUNTER — Other Ambulatory Visit: Payer: Self-pay

## 2023-05-12 ENCOUNTER — Ambulatory Visit: Payer: Commercial Managed Care - PPO

## 2023-05-12 ENCOUNTER — Encounter: Payer: Self-pay | Admitting: Medical-Surgical

## 2023-05-12 ENCOUNTER — Ambulatory Visit (INDEPENDENT_AMBULATORY_CARE_PROVIDER_SITE_OTHER): Payer: Commercial Managed Care - PPO

## 2023-05-12 DIAGNOSIS — Z13820 Encounter for screening for osteoporosis: Secondary | ICD-10-CM | POA: Diagnosis not present

## 2023-05-12 DIAGNOSIS — Z780 Asymptomatic menopausal state: Secondary | ICD-10-CM | POA: Diagnosis not present

## 2023-05-12 DIAGNOSIS — M8588 Other specified disorders of bone density and structure, other site: Secondary | ICD-10-CM | POA: Diagnosis not present

## 2023-05-12 DIAGNOSIS — Z1231 Encounter for screening mammogram for malignant neoplasm of breast: Secondary | ICD-10-CM

## 2023-05-17 ENCOUNTER — Encounter: Payer: Self-pay | Admitting: Medical-Surgical

## 2023-05-18 ENCOUNTER — Other Ambulatory Visit: Payer: Self-pay | Admitting: Medical-Surgical

## 2023-05-18 DIAGNOSIS — R928 Other abnormal and inconclusive findings on diagnostic imaging of breast: Secondary | ICD-10-CM

## 2023-05-19 ENCOUNTER — Other Ambulatory Visit: Payer: Self-pay

## 2023-05-19 ENCOUNTER — Other Ambulatory Visit: Payer: Self-pay | Admitting: Medical-Surgical

## 2023-05-19 MED ORDER — MELOXICAM 15 MG PO TABS
15.0000 mg | ORAL_TABLET | Freq: Every day | ORAL | 0 refills | Status: DC
Start: 2023-05-19 — End: 2023-06-09
  Filled 2023-05-19: qty 30, 30d supply, fill #0

## 2023-05-20 ENCOUNTER — Other Ambulatory Visit: Payer: Self-pay

## 2023-05-31 ENCOUNTER — Encounter: Payer: Self-pay | Admitting: Medical-Surgical

## 2023-05-31 ENCOUNTER — Ambulatory Visit: Payer: Commercial Managed Care - PPO

## 2023-05-31 ENCOUNTER — Ambulatory Visit
Admission: RE | Admit: 2023-05-31 | Discharge: 2023-05-31 | Disposition: A | Discharge status: Home / Self Care | Payer: Commercial Managed Care - PPO | Source: Ambulatory Visit | Attending: Medical-Surgical

## 2023-05-31 DIAGNOSIS — R928 Other abnormal and inconclusive findings on diagnostic imaging of breast: Secondary | ICD-10-CM | POA: Diagnosis not present

## 2023-06-01 DIAGNOSIS — H26492 Other secondary cataract, left eye: Secondary | ICD-10-CM | POA: Diagnosis not present

## 2023-06-01 DIAGNOSIS — E119 Type 2 diabetes mellitus without complications: Secondary | ICD-10-CM | POA: Diagnosis not present

## 2023-06-01 LAB — HM DIABETES EYE EXAM

## 2023-06-09 ENCOUNTER — Other Ambulatory Visit: Payer: Self-pay | Admitting: Medical-Surgical

## 2023-06-10 ENCOUNTER — Other Ambulatory Visit (HOSPITAL_COMMUNITY): Payer: Self-pay

## 2023-06-10 MED ORDER — MELOXICAM 15 MG PO TABS
15.0000 mg | ORAL_TABLET | Freq: Every day | ORAL | 0 refills | Status: DC
  Filled 2023-06-10 – 2023-06-12 (×2): qty 30, 30d supply, fill #0

## 2023-06-12 ENCOUNTER — Other Ambulatory Visit (HOSPITAL_COMMUNITY): Payer: Self-pay

## 2023-07-06 ENCOUNTER — Other Ambulatory Visit: Payer: Self-pay | Admitting: Medical-Surgical

## 2023-07-07 ENCOUNTER — Other Ambulatory Visit (HOSPITAL_COMMUNITY): Payer: Self-pay

## 2023-07-07 ENCOUNTER — Other Ambulatory Visit: Payer: Self-pay

## 2023-07-07 MED ORDER — ESOMEPRAZOLE MAGNESIUM 40 MG PO CPDR
40.0000 mg | DELAYED_RELEASE_CAPSULE | Freq: Every day | ORAL | 0 refills | Status: DC
  Filled 2023-07-07: qty 30, 30d supply, fill #0

## 2023-07-08 IMAGING — MG MM DIGITAL SCREENING BILAT W/ TOMO AND CAD
6 of 12 series · 6 of 36 positions shown · non-contrast
Comparison: None.

CLINICAL DATA: Screening.

EXAM:
DIGITAL SCREENING BILATERAL MAMMOGRAM WITH TOMOSYNTHESIS AND CAD
TECHNIQUE: Bilateral screening digital craniocaudal and mediolateral oblique
mammograms were obtained. Bilateral screening digital breast
tomosynthesis was performed. The images were evaluated with
computer-aided detection.

[R CC synth-2D (1 of 2)]
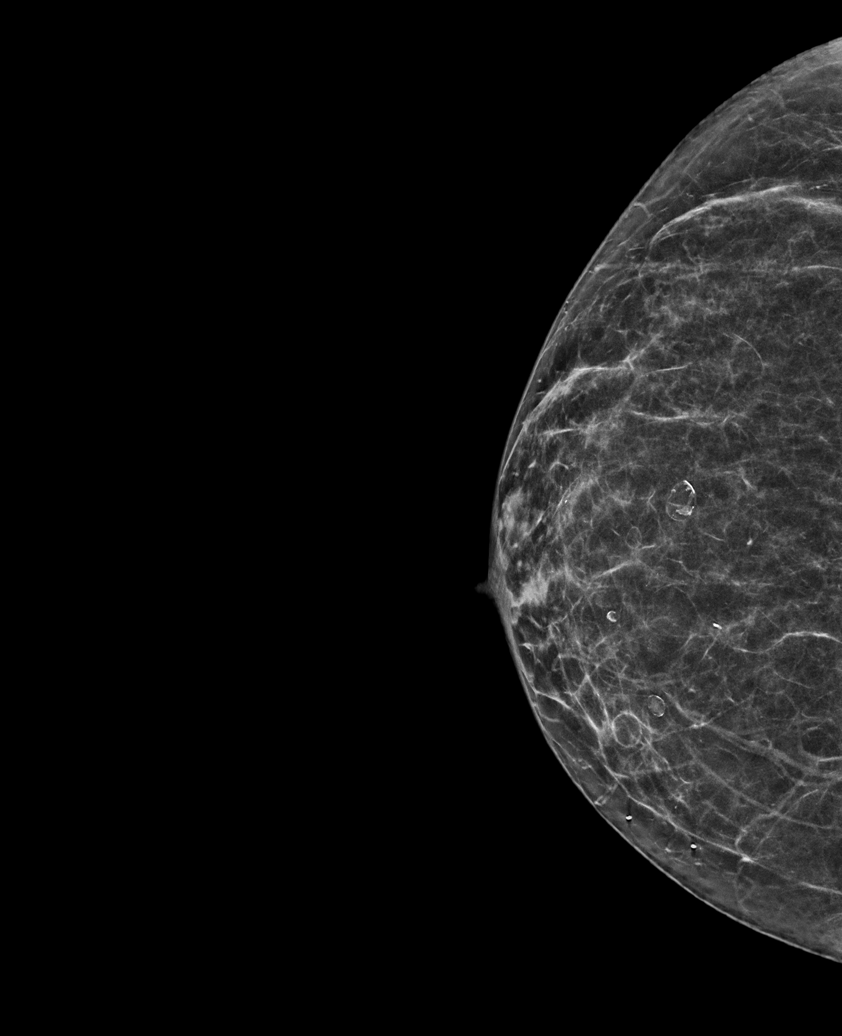

[L CC synth-2D (1 of 2)]
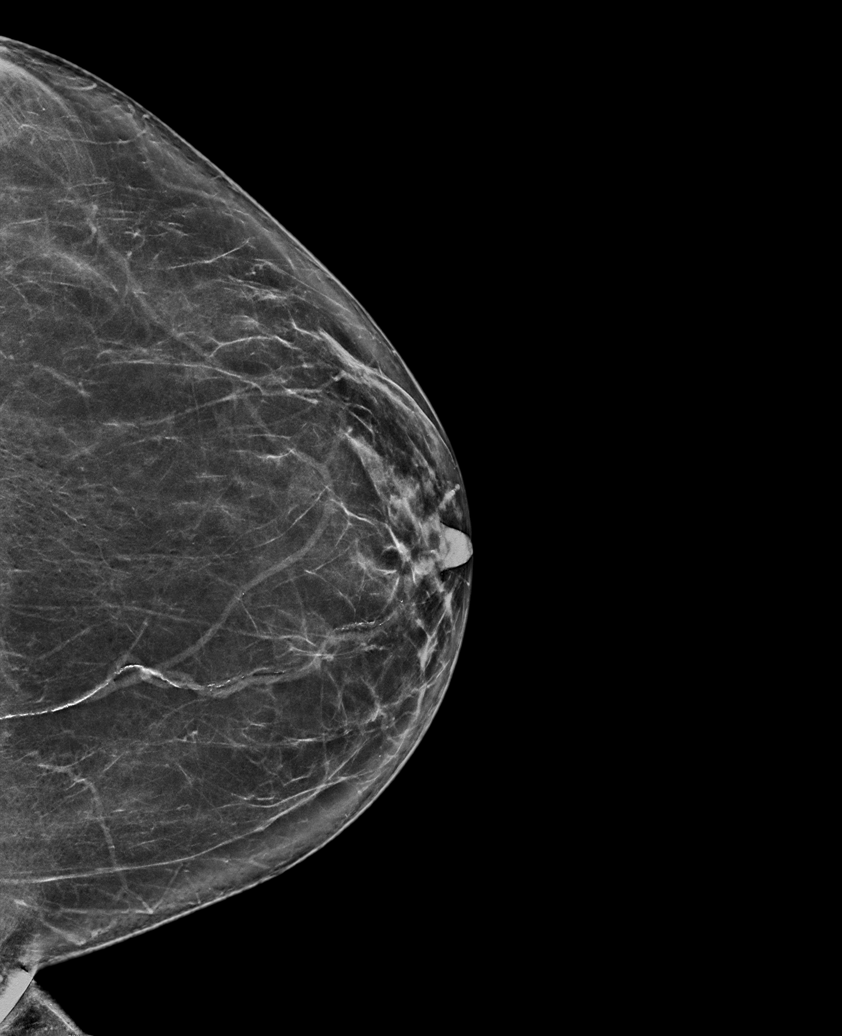

[R CC synth-2D (2 of 2)]
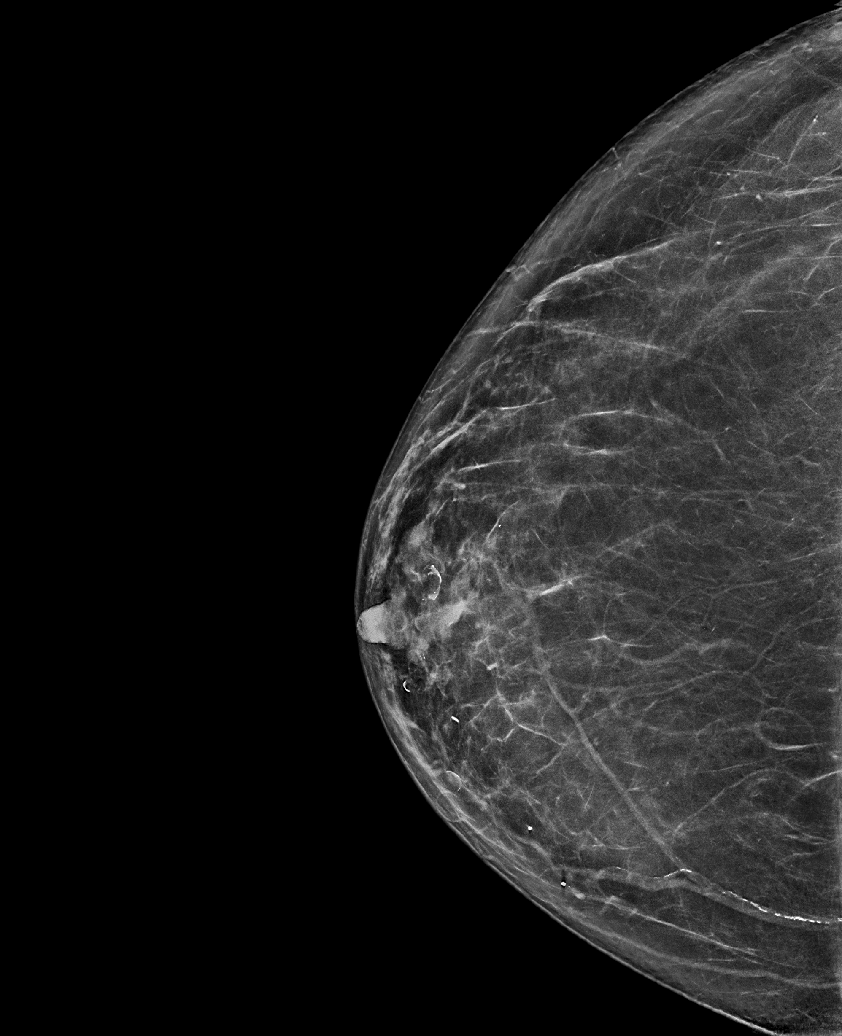

[L CC synth-2D (2 of 2)]
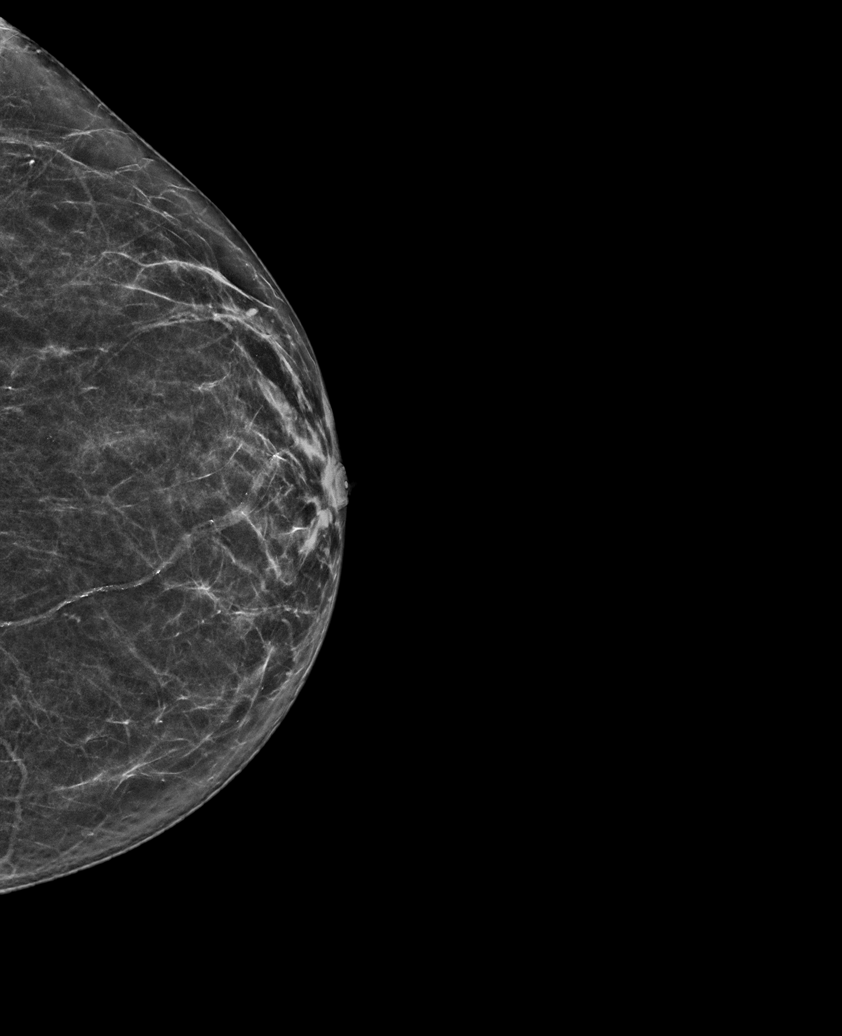

[R MLO synth-2D]
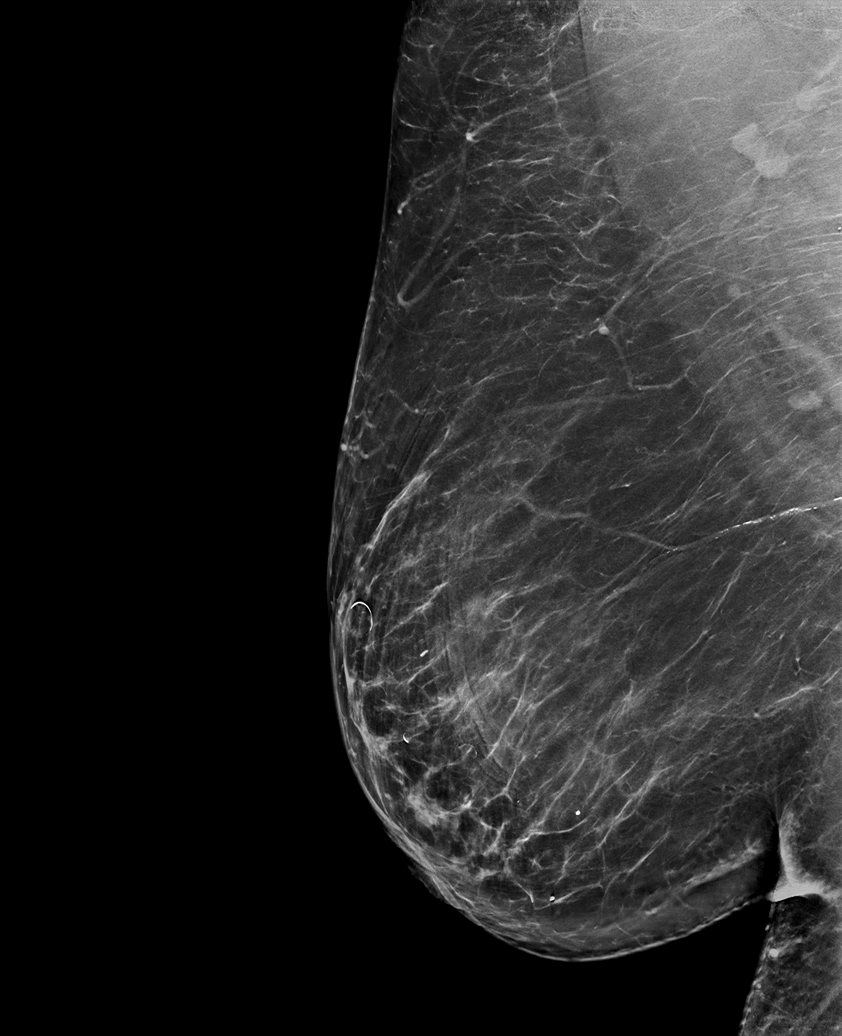

[L MLO synth-2D]
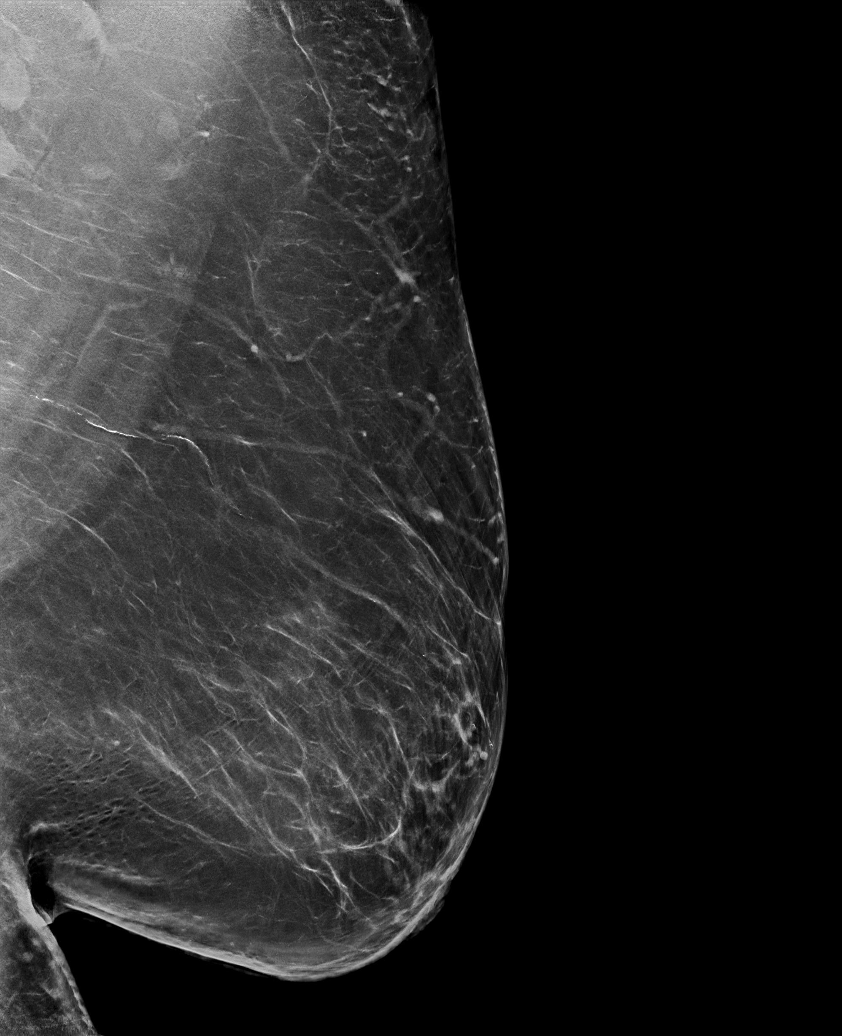

[6 of 36 positions shown; findings below may reference images not displayed]

ACR Breast Density Category b: There are scattered areas of
fibroglandular density.
FINDINGS: There are no findings suspicious for malignancy.
IMPRESSION: No mammographic evidence of malignancy. A result letter of this
screening mammogram will be mailed directly to the patient.

RECOMMENDATION:
Screening mammogram in one year. (Code:XG-[id number])

BI-RADS CATEGORY  1: Negative.

## 2023-07-14 ENCOUNTER — Other Ambulatory Visit: Payer: Self-pay | Admitting: Medical-Surgical

## 2023-07-14 ENCOUNTER — Other Ambulatory Visit (HOSPITAL_COMMUNITY): Payer: Self-pay

## 2023-07-14 MED ORDER — MELOXICAM 15 MG PO TABS
15.0000 mg | ORAL_TABLET | Freq: Every day | ORAL | 0 refills | Status: DC
  Filled 2023-07-14: qty 30, 30d supply, fill #0

## 2023-07-22 ENCOUNTER — Other Ambulatory Visit (HOSPITAL_COMMUNITY): Payer: Self-pay

## 2023-08-04 ENCOUNTER — Other Ambulatory Visit: Payer: Self-pay | Admitting: Medical-Surgical

## 2023-08-04 ENCOUNTER — Other Ambulatory Visit: Payer: Self-pay

## 2023-08-04 MED ORDER — ESOMEPRAZOLE MAGNESIUM 40 MG PO CPDR
40.0000 mg | DELAYED_RELEASE_CAPSULE | Freq: Every day | ORAL | 0 refills | Status: DC
  Filled 2023-08-04: qty 30, 30d supply, fill #0

## 2023-08-05 ENCOUNTER — Other Ambulatory Visit (HOSPITAL_COMMUNITY): Payer: Self-pay

## 2023-08-07 ENCOUNTER — Other Ambulatory Visit: Payer: Self-pay | Admitting: Medical-Surgical

## 2023-08-09 ENCOUNTER — Other Ambulatory Visit (HOSPITAL_COMMUNITY): Payer: Self-pay

## 2023-08-09 ENCOUNTER — Other Ambulatory Visit: Payer: Self-pay

## 2023-08-09 MED ORDER — MOUNJARO 7.5 MG/0.5ML ~~LOC~~ SOAJ
7.5000 mg | SUBCUTANEOUS | 0 refills | Status: DC
  Filled 2023-08-09: qty 6, 84d supply, fill #0

## 2023-08-09 MED ORDER — FLUOXETINE HCL 40 MG PO CAPS
40.0000 mg | ORAL_CAPSULE | Freq: Every day | ORAL | 0 refills | Status: DC
  Filled 2023-08-09: qty 90, 90d supply, fill #0

## 2023-08-16 ENCOUNTER — Other Ambulatory Visit: Payer: Self-pay | Admitting: Medical-Surgical

## 2023-08-18 ENCOUNTER — Other Ambulatory Visit (HOSPITAL_COMMUNITY): Payer: Self-pay

## 2023-08-20 ENCOUNTER — Other Ambulatory Visit: Payer: Self-pay

## 2023-08-20 ENCOUNTER — Ambulatory Visit: Payer: Commercial Managed Care - PPO | Admitting: Medical-Surgical

## 2023-08-20 ENCOUNTER — Other Ambulatory Visit (HOSPITAL_COMMUNITY): Payer: Self-pay

## 2023-08-20 ENCOUNTER — Other Ambulatory Visit: Payer: Self-pay | Admitting: Medical-Surgical

## 2023-08-20 ENCOUNTER — Ambulatory Visit

## 2023-08-20 ENCOUNTER — Encounter: Payer: Self-pay | Admitting: Medical-Surgical

## 2023-08-20 VITALS — BP 122/58 | HR 53 | Resp 20 | Wt 227.0 lb

## 2023-08-20 DIAGNOSIS — M5441 Lumbago with sciatica, right side: Secondary | ICD-10-CM

## 2023-08-20 DIAGNOSIS — E1165 Type 2 diabetes mellitus with hyperglycemia: Secondary | ICD-10-CM | POA: Diagnosis not present

## 2023-08-20 DIAGNOSIS — M654 Radial styloid tenosynovitis [de Quervain]: Secondary | ICD-10-CM

## 2023-08-20 DIAGNOSIS — I10 Essential (primary) hypertension: Secondary | ICD-10-CM | POA: Diagnosis not present

## 2023-08-20 DIAGNOSIS — M79641 Pain in right hand: Secondary | ICD-10-CM

## 2023-08-20 DIAGNOSIS — M1811 Unilateral primary osteoarthritis of first carpometacarpal joint, right hand: Secondary | ICD-10-CM | POA: Diagnosis not present

## 2023-08-20 DIAGNOSIS — K219 Gastro-esophageal reflux disease without esophagitis: Secondary | ICD-10-CM

## 2023-08-20 DIAGNOSIS — M5442 Lumbago with sciatica, left side: Secondary | ICD-10-CM

## 2023-08-20 DIAGNOSIS — M1812 Unilateral primary osteoarthritis of first carpometacarpal joint, left hand: Secondary | ICD-10-CM | POA: Diagnosis not present

## 2023-08-20 DIAGNOSIS — M79642 Pain in left hand: Secondary | ICD-10-CM | POA: Diagnosis not present

## 2023-08-20 DIAGNOSIS — G5603 Carpal tunnel syndrome, bilateral upper limbs: Secondary | ICD-10-CM | POA: Diagnosis not present

## 2023-08-20 DIAGNOSIS — S62319D Displaced fracture of base of unspecified metacarpal bone, subsequent encounter for fracture with routine healing: Secondary | ICD-10-CM | POA: Diagnosis not present

## 2023-08-20 DIAGNOSIS — F418 Other specified anxiety disorders: Secondary | ICD-10-CM

## 2023-08-20 DIAGNOSIS — G8929 Other chronic pain: Secondary | ICD-10-CM

## 2023-08-20 DIAGNOSIS — E781 Pure hyperglyceridemia: Secondary | ICD-10-CM

## 2023-08-20 DIAGNOSIS — Z7984 Long term (current) use of oral hypoglycemic drugs: Secondary | ICD-10-CM

## 2023-08-20 DIAGNOSIS — Z7985 Long-term (current) use of injectable non-insulin antidiabetic drugs: Secondary | ICD-10-CM

## 2023-08-20 LAB — POCT GLYCOSYLATED HEMOGLOBIN (HGB A1C): Hemoglobin A1C: 5.3 % (ref 4.0–5.6)

## 2023-08-20 MED ORDER — CARVEDILOL 12.5 MG PO TABS
12.5000 mg | ORAL_TABLET | Freq: Two times a day (BID) | ORAL | 3 refills | Status: AC
  Filled 2023-08-20 – 2023-11-02 (×2): qty 180, 90d supply, fill #0
  Filled 2024-02-02: qty 180, 90d supply, fill #1
  Filled 2024-04-28: qty 180, 90d supply, fill #2

## 2023-08-20 MED ORDER — CELECOXIB 200 MG PO CAPS
200.0000 mg | ORAL_CAPSULE | Freq: Two times a day (BID) | ORAL | 3 refills | Status: DC
  Filled 2023-08-20 – 2023-08-24 (×2): qty 60, 30d supply, fill #0
  Filled 2023-09-19: qty 60, 30d supply, fill #1
  Filled 2023-10-21: qty 60, 30d supply, fill #2
  Filled 2023-11-17 (×2): qty 60, 30d supply, fill #3

## 2023-08-20 MED ORDER — LOSARTAN POTASSIUM 100 MG PO TABS
100.0000 mg | ORAL_TABLET | Freq: Every day | ORAL | 1 refills | Status: DC
  Filled 2023-08-20: qty 90, 90d supply, fill #0

## 2023-08-20 MED ORDER — GLIPIZIDE 5 MG PO TABS
5.0000 mg | ORAL_TABLET | Freq: Two times a day (BID) | ORAL | 1 refills | Status: DC
  Filled 2023-08-20: qty 180, 90d supply, fill #0

## 2023-08-20 MED ORDER — ESOMEPRAZOLE MAGNESIUM 40 MG PO CPDR
40.0000 mg | DELAYED_RELEASE_CAPSULE | Freq: Every day | ORAL | 0 refills | Status: DC
  Filled 2023-08-20 – 2023-09-04 (×2): qty 30, 30d supply, fill #0

## 2023-08-20 MED ORDER — TIRZEPATIDE 10 MG/0.5ML ~~LOC~~ SOAJ
10.0000 mg | SUBCUTANEOUS | 1 refills | Status: AC
  Filled 2023-08-20 – 2023-08-23 (×2): qty 6, 84d supply, fill #0
  Filled 2023-09-04: qty 2, 28d supply, fill #0
  Filled 2023-09-19 – 2023-10-11 (×5): qty 6, 84d supply, fill #0
  Filled 2023-10-18: qty 2, 28d supply, fill #0
  Filled 2023-11-02 – 2023-11-10 (×2): qty 2, 28d supply, fill #1
  Filled 2023-12-08: qty 2, 28d supply, fill #2
  Filled 2024-01-06: qty 2, 28d supply, fill #3
  Filled 2024-02-02: qty 2, 28d supply, fill #4
  Filled 2024-02-24 – 2024-02-28 (×2): qty 2, 28d supply, fill #5

## 2023-08-20 MED ORDER — GLIPIZIDE 5 MG PO TABS
5.0000 mg | ORAL_TABLET | Freq: Two times a day (BID) | ORAL | 1 refills | Status: DC
  Filled 2023-08-20 – 2023-10-21 (×2): qty 180, 90d supply, fill #0
  Filled 2024-01-17: qty 180, 90d supply, fill #1

## 2023-08-20 MED ORDER — MELOXICAM 15 MG PO TABS
15.0000 mg | ORAL_TABLET | Freq: Every day | ORAL | 0 refills | Status: DC
  Filled 2023-08-20: qty 30, 30d supply, fill #0

## 2023-08-20 MED ORDER — AMLODIPINE BESYLATE 10 MG PO TABS
10.0000 mg | ORAL_TABLET | Freq: Every day | ORAL | 3 refills | Status: AC
  Filled 2023-08-20 – 2023-10-21 (×2): qty 90, 90d supply, fill #0
  Filled 2024-01-15: qty 90, 90d supply, fill #1
  Filled 2024-04-14: qty 90, 90d supply, fill #2

## 2023-08-20 MED ORDER — CARVEDILOL 12.5 MG PO TABS
12.5000 mg | ORAL_TABLET | Freq: Two times a day (BID) | ORAL | 1 refills | Status: DC
  Filled 2023-08-20: qty 180, 90d supply, fill #0

## 2023-08-20 MED ORDER — AMLODIPINE BESYLATE 10 MG PO TABS
10.0000 mg | ORAL_TABLET | Freq: Every day | ORAL | 1 refills | Status: DC
  Filled 2023-08-20: qty 90, 90d supply, fill #0

## 2023-08-20 MED ORDER — FLUOXETINE HCL 40 MG PO CAPS
40.0000 mg | ORAL_CAPSULE | Freq: Every day | ORAL | 0 refills | Status: DC
  Filled 2023-08-20: qty 90, 90d supply, fill #0

## 2023-08-20 MED ORDER — FLUOXETINE HCL 40 MG PO CAPS
40.0000 mg | ORAL_CAPSULE | Freq: Every day | ORAL | 3 refills | Status: AC
  Filled 2023-08-20 – 2023-11-03 (×2): qty 90, 90d supply, fill #0
  Filled 2024-02-02: qty 90, 90d supply, fill #1

## 2023-08-20 MED ORDER — LOSARTAN POTASSIUM 100 MG PO TABS
100.0000 mg | ORAL_TABLET | Freq: Every day | ORAL | 3 refills | Status: AC
  Filled 2023-08-20 – 2023-11-03 (×2): qty 90, 90d supply, fill #0
  Filled 2024-01-31: qty 90, 90d supply, fill #1
  Filled 2024-04-27: qty 90, 90d supply, fill #2

## 2023-08-20 NOTE — Progress Notes (Signed)
 Established patient visit  History, exam, impression, and plan:  1. Type 2 diabetes mellitus with hyperglycemia, without long-term current use of insulin  (HCC) (Primary) Very pleasant 68 year old female presenting today for follow-up on type 2 diabetes.  She has been taking glipizide  5 mg twice daily and Mounjaro  7.5 mg weekly.  Tolerating both medications well without side effects.  Spot checks her sugars at home and notes that most of her readings are between 80 and the low 100s.  Her biggest complaint at this point is that she continues to gain weight even being on Mounjaro .  She is up-to-date on recommended diabetic care.  Prior POCT hemoglobin A1c 5.3%.  Recheck today at 5.3% showing continued excellent control of glucose.  Discussed options for management.  Plan to increase Mounjaro  to 10 mg weekly.  Reduce glipizide  to 5 mg once daily.  Monitor sugars regularly and if after a month, sugars are still at goal, okay to stop glipizide  for a 2-4-week trial to see if glucose levels remain stable.  Discussed importance of maintaining dietary compliance with diabetic diet recommendations as well as portion control recommendations.  Working on getting regular intentional exercise will also help with weight management.  Patient verbalized understanding and is agreeable to the plan. - CMP14+EGFR - POCT HgB A1C - tirzepatide  (MOUNJARO ) 10 MG/0.5ML Pen; Inject 10 mg into the skin once a week.  Dispense: 6 mL; Refill: 1 - glipiZIDE  (GLUCOTROL ) 5 MG tablet; Take 1 tablet (5 mg total) by mouth 2 (two) times daily.  Dispense: 180 tablet; Refill: 1  2. Essential hypertension Currently taking amlodipine  10 mg daily, losartan  100 mg daily, and carvedilol  12.5 mg twice daily.  Tolerating all medications well without side effects.  Only checks her blood pressure occasionally but reports readings have been at goal.  Not regularly exercising outside of walking and being on her feet a lot at work.  Follows a  low-sodium heart healthy diet as much as she can while working third shift.  Denies chest pain, shortness of breath, palpitations, headaches, dizziness, and lower extremity edema.  Cardiopulmonary exam is normal.  Checking labs as below.  Blood pressure elevated on arrival however at goal on recheck.  Continue amlodipine , carvedilol , and losartan  as prescribed. - CBC with Differential/Platelet - CMP14+EGFR - Lipid panel - amLODipine  (NORVASC ) 10 MG tablet; Take 1 tablet (10 mg total) by mouth daily.  Dispense: 90 tablet; Refill: 3 - carvedilol  (COREG ) 12.5 MG tablet; Take 1 tablet (12.5 mg total) by mouth 2 (two) times daily with a meal.  Dispense: 180 tablet; Refill: 3 - losartan  (COZAAR ) 100 MG tablet; Take 1 tablet (100 mg total) by mouth daily.  Dispense: 90 tablet; Refill: 3  3. Hypertriglyceridemia She is not currently taking any medication for management of her cholesterol despite guideline recommendations for diabetic care.  Admits that she prefers to avoid statin medication unless it becomes absolutely necessary.  Plan to check labs as below for further evaluation. - CMP14+EGFR - Lipid panel  4. Chronic bilateral low back pain with bilateral sciatica Has a history of chronic low back pain with sciatica that occasionally flares.  She has been taking meloxicam  15 mg daily along with Tylenol  however it does not seem to be as effective as she would like.  It seems to be working okay for her back pain but her hands have been causing her trouble (see below).  Plan to discontinue meloxicam .  Start Celebrex  200 mg twice daily as  needed. - celecoxib  (CELEBREX ) 200 MG capsule; Take 1 capsule (200 mg total) by mouth 2 (two) times daily.  Dispense: 60 capsule; Refill: 3  5. Bilateral hand pain Has been having significant hand pain especially at the [facility] joints bilaterally but also at the right middle finger extending back into the hand and the wrist.  As noted above, we will switch from meloxicam  to  Celebrex  to see if this is a better choice for her.  Getting x-rays of the bilateral hands for further evaluation. - celecoxib  (CELEBREX ) 200 MG capsule; Take 1 capsule (200 mg total) by mouth 2 (two) times daily.  Dispense: 60 capsule; Refill: 3 - DG Hand Complete Left; Future - DG Hand Complete Right; Future  6. Bilateral carpal tunnel syndrome Describes numbness and tingling that affects both of her hands and extends into the wrists.  Positive Phalen's and Tinel's tests.  She has braces at home already.  Switching to Celebrex .  Recommend bracing when sleeping.  Home exercises printed and provided to patient.  If no benefit in 4-6 weeks, return for further evaluation and management options with Dr. Sandy Crumb. - celecoxib  (CELEBREX ) 200 MG capsule; Take 1 capsule (200 mg total) by mouth 2 (two) times daily.  Dispense: 60 capsule; Refill: 3  7. De Quervain's tenosynovitis, bilateral In addition to the [facility] joint pain bilaterally, she also reports pain that extends up the wrists and into the forearm bilaterally.  Positive Finkelstein's tests on both hands.  Notes that the braces she has for carpal tunnel also have a thumb spica.  Recommend bracing, anti-inflammatories and completion of home exercises that were provided.  If no benefit in 4 to 6 weeks, plan to return for further evaluation and management options with Dr. Sandy Crumb. - celecoxib  (CELEBREX ) 200 MG capsule; Take 1 capsule (200 mg total) by mouth 2 (two) times daily.  Dispense: 60 capsule; Refill: 3  8. Anxiety with depression History of anxiety and depression currently treated with fluoxetine  40 mg daily.  Feels the medication is working well for her and keeping her mood stable.  Denies SI/HI.  During appointment, mood, affect, speech, thought pattern, and cognition are normal.  Continue fluoxetine  as prescribed. - FLUoxetine  (PROZAC ) 40 MG capsule; Take 1 capsule (40 mg total) by mouth daily.  Dispense: 90 capsule; Refill: 3  9.  Gastroesophageal reflux disease, unspecified whether esophagitis present Currently taking Nexium  40 mg daily as needed.  Reports that this medication helps tremendously and keeps her symptoms well-managed.  Aware of risks versus benefits of long-term PPI treatment.  Has tried coming off the medication for the past with resurgence of her reflux.  Continue Nexium  as prescribed. - esomeprazole  (NEXIUM ) 40 MG capsule; Take 1 capsule (40 mg total) by mouth daily 30 minutes before a meal  Dispense: 30 capsule; Refill: 0   Procedures performed this visit: None.  Return in about 6 months (around 02/20/2024) for DM/HTN/HLD follow up.  __________________________________ Alicia Snook, DNP, APRN, FNP-BC Primary Care and Sports Medicine [facility] [place] [facility]

## 2023-08-21 ENCOUNTER — Ambulatory Visit: Payer: Self-pay | Admitting: Medical-Surgical

## 2023-08-21 LAB — CMP14+EGFR
ALT: 15 IU/L (ref 0–32)
AST: 16 IU/L (ref 0–40)
Albumin: 4 g/dL (ref 3.9–4.9)
Alkaline Phosphatase: 156 IU/L — ABNORMAL HIGH (ref 44–121)
BUN/Creatinine Ratio: 41 — ABNORMAL HIGH (ref 12–28)
BUN: 26 mg/dL (ref 8–27)
Bilirubin Total: 0.2 mg/dL (ref 0.0–1.2)
CO2: 19 mmol/L — ABNORMAL LOW (ref 20–29)
Calcium: 9.1 mg/dL (ref 8.7–10.3)
Chloride: 106 mmol/L (ref 96–106)
Creatinine, Ser: 0.64 mg/dL (ref 0.57–1.00)
Globulin, Total: 2.3 g/dL (ref 1.5–4.5)
Glucose: 99 mg/dL (ref 70–99)
Potassium: 4.3 mmol/L (ref 3.5–5.2)
Sodium: 144 mmol/L (ref 134–144)
Total Protein: 6.3 g/dL (ref 6.0–8.5)
eGFR: 98 mL/min/{1.73_m2} (ref >59)

## 2023-08-21 LAB — CBC WITH DIFFERENTIAL/PLATELET
Basophils Absolute: 0 10*3/uL (ref 0.0–0.2)
Basos: 0 %
EOS (ABSOLUTE): 0.2 10*3/uL (ref 0.0–0.4)
Eos: 3 %
Hematocrit: 35.6 % (ref 34.0–46.6)
Hemoglobin: 11.8 g/dL (ref 11.1–15.9)
Immature Grans (Abs): 0 10*3/uL (ref 0.0–0.1)
Immature Granulocytes: 0 %
Lymphocytes Absolute: 1.9 10*3/uL (ref 0.7–3.1)
Lymphs: 25 %
MCH: 30.7 pg (ref 26.6–33.0)
MCHC: 33.1 g/dL (ref 31.5–35.7)
MCV: 93 fL (ref 79–97)
Monocytes Absolute: 0.6 10*3/uL (ref 0.1–0.9)
Monocytes: 7 %
Neutrophils Absolute: 5.1 10*3/uL (ref 1.4–7.0)
Neutrophils: 65 %
Platelets: 247 10*3/uL (ref 150–450)
RBC: 3.84 x10E6/uL (ref 3.77–5.28)
RDW: 13.1 % (ref 11.7–15.4)
WBC: 7.8 10*3/uL (ref 3.4–10.8)

## 2023-08-21 LAB — LIPID PANEL
Chol/HDL Ratio: 3.5 ratio (ref 0.0–4.4)
Cholesterol, Total: 160 mg/dL (ref 100–199)
HDL: 46 mg/dL (ref >39)
LDL Chol Calc (NIH): 93 mg/dL (ref 0–99)
Triglycerides: 119 mg/dL (ref 0–149)
VLDL Cholesterol Cal: 21 mg/dL (ref 5–40)

## 2023-08-23 ENCOUNTER — Encounter (HOSPITAL_COMMUNITY): Payer: Self-pay

## 2023-08-24 ENCOUNTER — Other Ambulatory Visit: Payer: Self-pay

## 2023-08-24 ENCOUNTER — Other Ambulatory Visit (HOSPITAL_COMMUNITY): Payer: Self-pay

## 2023-09-06 ENCOUNTER — Other Ambulatory Visit (HOSPITAL_COMMUNITY): Payer: Self-pay

## 2023-09-06 ENCOUNTER — Other Ambulatory Visit: Payer: Self-pay

## 2023-09-07 ENCOUNTER — Other Ambulatory Visit: Payer: Self-pay

## 2023-09-10 ENCOUNTER — Telehealth: Admitting: Family Medicine

## 2023-09-10 DIAGNOSIS — H9202 Otalgia, left ear: Secondary | ICD-10-CM

## 2023-09-10 MED ORDER — AMOXICILLIN 875 MG PO TABS: 875.0000 mg | ORAL_TABLET | Freq: Two times a day (BID) | ORAL | 0 refills | Status: AC

## 2023-09-10 NOTE — Progress Notes (Signed)
 E-Visit for Ear Pain - Acute Otitis Media   We are sorry that you are not feeling well. Here is how we plan to help!  Based on what you have shared with me it looks like you have Acute Otitis Media.  Acute Otitis Media is an infection of the middle or "inner" ear. This type of infection can cause redness, inflammation, and fluid buildup behind the tympanic membrane (ear drum).  The usual symptoms include: Earache/Pain Fever Upper respiratory symptoms Lack of energy/Fatigue/Malaise Slight hearing loss gradually worsening- if the inner ear fills with fluid What causes middle ear infections? Most middle ear infections occur when an infection such as a cold, leads to a build-up of mucus in the middle ear and causes the Eustachian tube (a thin tube that runs from the middle ear to the back of the nose) to become swollen or blocked.   This means mucus can't drain away properly, making it easier for an infection to spread into the middle ear.  How middle ear infections are treated: Most ear infections clear up within three to five days and don't need any specific treatment. If necessary, tylenol or ibuprofen should be used to relieve pain and a high temperature.  If you develop a fever higher than 102, or any significantly worsening symptoms, this could indicate a more serious infection moving to the middle/inner and needs face to face evaluation in an office by a provider.   Antibiotics aren't routinely used to treat middle ear infections, although they may occasionally be prescribed if symptoms persist or are particularly severe. Given your presentation,   I have prescribed Amoxicillin 875 mg one tablet twice daily for 10 days     Your symptoms should improve over the next 3 days and should resolve in about 7 days. Be sure to complete ALL of the prescription(s) given.  HOME CARE: Wash your hands frequently. If you are prescribed an ear drop, do not place the tip of the bottle on your ear or  touch it with your fingers. You can take Acetaminophen 650 mg every 4-6 hours as needed for pain.  If pain is severe or moderate, you can apply a heating pad (set on low) or hot water bottle (wrapped in a towel) to outer ear for 20 minutes.  This will also increase drainage.  GET HELP RIGHT AWAY IF: Fever is over 102.2 degrees. You develop progressive ear pain or hearing loss. Ear symptoms persist longer than 3 days after treatment.  MAKE SURE YOU: Understand these instructions. Will watch your condition. Will get help right away if you are not doing well or get worse.  Thank you for choosing an e-visit.  Your e-visit answers were reviewed by a board certified advanced clinical practitioner to complete your personal care plan. Depending upon the condition, your plan could have included both over the counter or prescription medications.  Please review your pharmacy choice. Make sure the pharmacy is open so you can pick up the prescription now. If there is a problem, you may contact your provider through [employer] and have the prescription routed to another pharmacy.  Your safety is important to [place]. If you have drug allergies check your prescription carefully.   For the next 24 hours you can use MyChart to ask questions about today's visit, request a non-urgent call back, or ask for a work or school excuse. You will get an email with a survey after your eVisit asking about your experience. We would appreciate your feedback.  I hope that your e-visit has been valuable and will aid in your recovery.   have provided 5 minutes of non face to face time during this encounter for chart review and documentation.

## 2023-09-20 ENCOUNTER — Other Ambulatory Visit (HOSPITAL_COMMUNITY): Payer: Self-pay

## 2023-09-20 ENCOUNTER — Other Ambulatory Visit: Payer: Self-pay

## 2023-09-29 ENCOUNTER — Other Ambulatory Visit: Payer: Self-pay

## 2023-09-30 ENCOUNTER — Other Ambulatory Visit: Payer: Self-pay | Admitting: Medical-Surgical

## 2023-09-30 DIAGNOSIS — K219 Gastro-esophageal reflux disease without esophagitis: Secondary | ICD-10-CM

## 2023-10-01 ENCOUNTER — Other Ambulatory Visit (HOSPITAL_COMMUNITY): Payer: Self-pay

## 2023-10-04 MED ORDER — ESOMEPRAZOLE MAGNESIUM 40 MG PO CPDR
40.0000 mg | DELAYED_RELEASE_CAPSULE | Freq: Every day | ORAL | 0 refills | Status: DC
  Filled 2023-10-04: qty 30, 30d supply, fill #0

## 2023-10-05 ENCOUNTER — Other Ambulatory Visit (HOSPITAL_COMMUNITY): Payer: Self-pay

## 2023-10-05 ENCOUNTER — Other Ambulatory Visit: Payer: Self-pay

## 2023-10-11 ENCOUNTER — Other Ambulatory Visit (HOSPITAL_COMMUNITY): Payer: Self-pay

## 2023-10-11 ENCOUNTER — Other Ambulatory Visit: Payer: Self-pay

## 2023-10-18 ENCOUNTER — Other Ambulatory Visit (HOSPITAL_COMMUNITY): Payer: Self-pay

## 2023-10-18 ENCOUNTER — Other Ambulatory Visit: Payer: Self-pay

## 2023-10-21 ENCOUNTER — Other Ambulatory Visit: Payer: Self-pay

## 2023-10-31 ENCOUNTER — Other Ambulatory Visit: Payer: Self-pay | Admitting: Medical-Surgical

## 2023-10-31 DIAGNOSIS — K219 Gastro-esophageal reflux disease without esophagitis: Secondary | ICD-10-CM

## 2023-11-01 ENCOUNTER — Other Ambulatory Visit (HOSPITAL_COMMUNITY): Payer: Self-pay

## 2023-11-01 ENCOUNTER — Other Ambulatory Visit: Payer: Self-pay

## 2023-11-01 MED ORDER — ESOMEPRAZOLE MAGNESIUM 40 MG PO CPDR
40.0000 mg | DELAYED_RELEASE_CAPSULE | Freq: Every day | ORAL | 3 refills | Status: DC
  Filled 2023-11-01: qty 30, 30d supply, fill #0
  Filled 2023-12-01: qty 30, 30d supply, fill #1
  Filled 2023-12-29: qty 30, 30d supply, fill #2
  Filled 2024-01-26: qty 30, 30d supply, fill #3

## 2023-11-02 ENCOUNTER — Other Ambulatory Visit (HOSPITAL_COMMUNITY): Payer: Self-pay

## 2023-11-02 ENCOUNTER — Other Ambulatory Visit: Payer: Self-pay

## 2023-11-03 ENCOUNTER — Other Ambulatory Visit (HOSPITAL_COMMUNITY): Payer: Self-pay

## 2023-11-06 DIAGNOSIS — H9209 Otalgia, unspecified ear: Secondary | ICD-10-CM | POA: Diagnosis not present

## 2023-11-10 ENCOUNTER — Other Ambulatory Visit (HOSPITAL_COMMUNITY): Payer: Self-pay

## 2023-11-17 ENCOUNTER — Other Ambulatory Visit (HOSPITAL_COMMUNITY): Payer: Self-pay

## 2023-12-01 ENCOUNTER — Other Ambulatory Visit (HOSPITAL_COMMUNITY): Payer: Self-pay

## 2023-12-09 ENCOUNTER — Other Ambulatory Visit (HOSPITAL_COMMUNITY): Payer: Self-pay

## 2023-12-24 ENCOUNTER — Other Ambulatory Visit: Payer: Self-pay

## 2023-12-24 ENCOUNTER — Other Ambulatory Visit (HOSPITAL_COMMUNITY): Payer: Self-pay

## 2023-12-24 DIAGNOSIS — H90A32 Mixed conductive and sensorineural hearing loss, unilateral, left ear with restricted hearing on the contralateral side: Secondary | ICD-10-CM | POA: Diagnosis not present

## 2023-12-24 DIAGNOSIS — H90A21 Sensorineural hearing loss, unilateral, right ear, with restricted hearing on the contralateral side: Secondary | ICD-10-CM | POA: Diagnosis not present

## 2023-12-24 DIAGNOSIS — H60542 Acute eczematoid otitis externa, left ear: Secondary | ICD-10-CM | POA: Diagnosis not present

## 2023-12-24 DIAGNOSIS — H6992 Unspecified Eustachian tube disorder, left ear: Secondary | ICD-10-CM | POA: Diagnosis not present

## 2023-12-24 MED ORDER — FLUOCINOLONE ACETONIDE 0.01 % EX SOLN
3.0000 [drp] | CUTANEOUS | 0 refills | Status: AC
  Filled 2023-12-24: qty 60, 30d supply, fill #0

## 2023-12-27 ENCOUNTER — Other Ambulatory Visit (HOSPITAL_COMMUNITY): Payer: Self-pay

## 2023-12-27 MED ORDER — FLUZONE HIGH-DOSE 0.5 ML IM SUSY
PREFILLED_SYRINGE | INTRAMUSCULAR | 0 refills | Status: AC
  Filled 2023-12-27: qty 0.5, 1d supply, fill #0

## 2023-12-29 ENCOUNTER — Other Ambulatory Visit: Payer: Self-pay | Admitting: Medical-Surgical

## 2023-12-29 ENCOUNTER — Other Ambulatory Visit: Payer: Self-pay

## 2023-12-29 ENCOUNTER — Other Ambulatory Visit (HOSPITAL_COMMUNITY): Payer: Self-pay

## 2023-12-29 DIAGNOSIS — M654 Radial styloid tenosynovitis [de Quervain]: Secondary | ICD-10-CM

## 2023-12-29 DIAGNOSIS — M79641 Pain in right hand: Secondary | ICD-10-CM

## 2023-12-29 DIAGNOSIS — G5603 Carpal tunnel syndrome, bilateral upper limbs: Secondary | ICD-10-CM

## 2023-12-29 DIAGNOSIS — G8929 Other chronic pain: Secondary | ICD-10-CM

## 2023-12-29 MED ORDER — CELECOXIB 200 MG PO CAPS
200.0000 mg | ORAL_CAPSULE | Freq: Two times a day (BID) | ORAL | 3 refills | Status: AC
Start: 2023-12-29 — End: ?
  Filled 2023-12-29: qty 60, 30d supply, fill #0
  Filled 2024-01-23: qty 60, 30d supply, fill #1
  Filled 2024-02-24: qty 60, 30d supply, fill #2
  Filled 2024-03-23: qty 60, 30d supply, fill #3

## 2024-01-06 ENCOUNTER — Other Ambulatory Visit (HOSPITAL_COMMUNITY): Payer: Self-pay

## 2024-01-15 ENCOUNTER — Other Ambulatory Visit (HOSPITAL_COMMUNITY): Payer: Self-pay

## 2024-01-17 ENCOUNTER — Other Ambulatory Visit (HOSPITAL_COMMUNITY): Payer: Self-pay

## 2024-01-24 ENCOUNTER — Other Ambulatory Visit (HOSPITAL_COMMUNITY): Payer: Self-pay

## 2024-01-25 DIAGNOSIS — H60542 Acute eczematoid otitis externa, left ear: Secondary | ICD-10-CM | POA: Diagnosis not present

## 2024-01-25 DIAGNOSIS — J309 Allergic rhinitis, unspecified: Secondary | ICD-10-CM | POA: Diagnosis not present

## 2024-01-26 ENCOUNTER — Other Ambulatory Visit (HOSPITAL_COMMUNITY): Payer: Self-pay

## 2024-01-27 ENCOUNTER — Other Ambulatory Visit (HOSPITAL_COMMUNITY): Payer: Self-pay

## 2024-01-28 ENCOUNTER — Other Ambulatory Visit: Payer: Self-pay

## 2024-01-28 ENCOUNTER — Other Ambulatory Visit (HOSPITAL_COMMUNITY): Payer: Self-pay

## 2024-01-28 MED ORDER — AZELASTINE HCL 0.1 % NA SOLN
1.0000 | Freq: Two times a day (BID) | NASAL | 1 refills | Status: AC
  Filled 2024-01-28: qty 30, 90d supply, fill #0
  Filled 2024-04-25: qty 30, 30d supply, fill #1

## 2024-01-31 ENCOUNTER — Other Ambulatory Visit: Payer: Self-pay

## 2024-01-31 ENCOUNTER — Other Ambulatory Visit (HOSPITAL_COMMUNITY): Payer: Self-pay

## 2024-01-31 ENCOUNTER — Encounter (HOSPITAL_COMMUNITY): Payer: Self-pay

## 2024-02-03 ENCOUNTER — Other Ambulatory Visit: Payer: Self-pay

## 2024-02-21 ENCOUNTER — Ambulatory Visit: Admitting: Medical-Surgical

## 2024-02-21 DIAGNOSIS — E1165 Type 2 diabetes mellitus with hyperglycemia: Secondary | ICD-10-CM

## 2024-02-21 NOTE — Progress Notes (Deleted)
        Established patient visit   History of Present Illness   Discussed the use of AI scribe software for clinical note transcription with the patient, who gave verbal consent to proceed.  History of Present Illness           Physical Exam   Physical Exam Assessment & Plan   Problem List Items Addressed This Visit       Cardiovascular and Mediastinum   Essential hypertension     Endocrine   Type 2 diabetes mellitus with hyperglycemia, without long-term current use of insulin  (HCC) - Primary   Assessment and Plan             Follow up   No follow-ups on file. __________________________________ Zada FREDRIK Palin, DNP, APRN, FNP-BC Primary Care and Sports Medicine [facility] [place]

## 2024-02-22 ENCOUNTER — Ambulatory Visit: Admitting: Medical-Surgical

## 2024-02-22 ENCOUNTER — Encounter: Payer: Self-pay | Admitting: Medical-Surgical

## 2024-02-22 VITALS — BP 136/65 | HR 57 | Resp 20 | Ht 64.0 in | Wt 229.0 lb

## 2024-02-22 DIAGNOSIS — E1165 Type 2 diabetes mellitus with hyperglycemia: Secondary | ICD-10-CM

## 2024-02-22 DIAGNOSIS — R051 Acute cough: Secondary | ICD-10-CM

## 2024-02-22 DIAGNOSIS — I10 Essential (primary) hypertension: Secondary | ICD-10-CM | POA: Diagnosis not present

## 2024-02-22 DIAGNOSIS — H6502 Acute serous otitis media, left ear: Secondary | ICD-10-CM

## 2024-02-22 DIAGNOSIS — H60392 Other infective otitis externa, left ear: Secondary | ICD-10-CM | POA: Diagnosis not present

## 2024-02-22 DIAGNOSIS — Z7984 Long term (current) use of oral hypoglycemic drugs: Secondary | ICD-10-CM | POA: Diagnosis not present

## 2024-02-22 LAB — POCT GLYCOSYLATED HEMOGLOBIN (HGB A1C)
HbA1c, POC (controlled diabetic range): 5.4 % (ref 0.0–7.0)
Hemoglobin A1C: 5.4 % (ref 4.0–5.6)

## 2024-02-22 LAB — POCT UA - MICROALBUMIN
Albumin/Creatinine Ratio, Urine, POC: <30
Creatinine, POC: 300 mg/dL
Microalbumin Ur, POC: 30 mg/L

## 2024-02-22 MED ORDER — AMOXICILLIN-POT CLAVULANATE 875-125 MG PO TABS
1.0000 | ORAL_TABLET | Freq: Two times a day (BID) | ORAL | 0 refills | Status: AC
  Filled 2024-02-22 – 2024-02-23 (×2): qty 14, 7d supply, fill #0

## 2024-02-22 MED ORDER — CIPROFLOXACIN-DEXAMETHASONE 0.3-0.1 % OT SUSP
4.0000 [drp] | Freq: Two times a day (BID) | OTIC | 0 refills | Status: AC
  Filled 2024-02-22 – 2024-02-23 (×2): qty 7.5, 19d supply, fill #0

## 2024-02-22 NOTE — Progress Notes (Signed)
 Established Patient Office Visit  Subjective   Patient ID: Alicia Pineda, female    DOB: Aug 06, 1955  Age: 68 y.o. MRN: [id number]  Chief Complaint  Patient presents with   Diabetes   Hypertension   Hyperlipidemia    68 year old female presents for 6 month follow up for the following chronic diseases.  Type 2 Diabetes Patient is currently taking glipizide  5 mg two times daily and Mounjaro  10 mg weekly. Patient states that she is doing well with current regimen. She checks her blood sugars about twice per week with the average being between 80-130 depending upon her meal. Denies having any episodes of hypoglycemia.  Essential Hypertension Patient is currently taking amlodipine  10 mg daily, carvedilol  12.5 mg two times daily, and losartan  100 mg daily. She does not routinely check her blood pressures at home. She reports having occasional heart flutters that she has noticed over the last year.  Denies chest pain or dizziness.  Acute Cough Reports having a non productive cough for the past two weeks. Cough makes it difficult to get a full breath in. Relates cough to a recent flare of acid reflux, but states symptoms does not typically take this long to resolve. Denies any recent URI. Symptoms worse with exertion. She has not tried taking anything for the cough.  Left ear drainage and fullness  Patient reports having increased drainage, fullness, and discomfort that has been present off and on over past year. Patient was seen at ENT and was prescribe cipro  drops for otitis externa. Is currently using flonase and azelastine nasal spray with no relief. Reports having drainage coming from her ears daily from the eczema. Has not followed back up with ENT due to not having a good experience.  Review of Systems  Constitutional: Negative.   HENT: Negative.    Eyes: Negative.   Respiratory:  Positive for cough and shortness of breath.   Cardiovascular:  Positive for palpitations.   Genitourinary: Negative.   Musculoskeletal: Negative.   Skin: Negative.   Neurological: Negative.   Endo/Heme/Allergies: Negative.   Psychiatric/Behavioral: Negative.        Objective:     BP 136/65 (BP Location: Right Arm, Cuff Size: Normal)   Pulse (!) 57   Resp 20   Ht 5' 4 (1.626 m)   Wt 103.9 kg   SpO2 96%   BMI 39.31 kg/m  BP Readings from Last 3 Encounters:  02/22/24 136/65  08/20/23 (!) 122/58  04/22/23 136/64   Wt Readings from Last 3 Encounters:  02/22/24 103.9 kg  08/20/23 103 kg  04/22/23 98.8 kg      Physical Exam Vitals and nursing note reviewed.  Constitutional:      General: She is not in acute distress.    Appearance: Normal appearance.  Cardiovascular:     Rate and Rhythm: Normal rate and regular rhythm.     Pulses: Normal pulses.     Heart sounds: Normal heart sounds.  Pulmonary:     Effort: Pulmonary effort is normal.     Breath sounds: Normal breath sounds.  Skin:    General: Skin is warm and dry.  Neurological:     General: No focal deficit present.     Mental Status: She is alert and oriented to person, place, and time.  Psychiatric:        Mood and Affect: Mood normal.        Behavior: Behavior normal.        Thought Content:  Thought content normal.        Judgment: Judgment normal.      Results for orders placed or performed in visit on 02/22/24  POCT HgB A1C  Result Value Ref Range   Hemoglobin A1C 5.4 4.0 - 5.6 %   HbA1c POC (<> result, manual entry)     HbA1c, POC (prediabetic range)     HbA1c, POC (controlled diabetic range) 5.4 0.0 - 7.0 %  POCT UA - Microalbumin  Result Value Ref Range   Microalbumin Ur, POC 30 mg/L   Creatinine, POC 300 mg/dL   Albumin/Creatinine Ratio, Urine, POC <30     Last CBC Lab Results  Component Value Date   WBC 7.8 08/20/2023   HGB 11.8 08/20/2023   HCT 35.6 08/20/2023   MCV 93 08/20/2023   MCH 30.7 08/20/2023   RDW 13.1 08/20/2023   PLT 247 08/20/2023   Last metabolic  panel Lab Results  Component Value Date   GLUCOSE 99 08/20/2023   NA 144 08/20/2023   K 4.3 08/20/2023   CL 106 08/20/2023   CO2 19 (L) 08/20/2023   BUN 26 08/20/2023   CREATININE 0.64 08/20/2023   EGFR 98 08/20/2023   CALCIUM 9.1 08/20/2023   PROT 6.3 08/20/2023   ALBUMIN 4.0 08/20/2023   LABGLOB 2.3 08/20/2023   BILITOT 0.2 08/20/2023   ALKPHOS 156 (H) 08/20/2023   AST 16 08/20/2023   ALT 15 08/20/2023   ANIONGAP 6 02/23/2023   Last lipids Lab Results  Component Value Date   CHOL 160 08/20/2023   HDL 46 08/20/2023   LDLCALC 93 08/20/2023   TRIG 119 08/20/2023   CHOLHDL 3.5 08/20/2023   Last hemoglobin A1c Lab Results  Component Value Date   HGBA1C 5.4 02/22/2024   HGBA1C 5.4 02/22/2024    The 10-year ASCVD risk score (Arnett DK, et al., 2019) is: 16.6%    Assessment & Plan:  1. Type 2 diabetes mellitus with hyperglycemia, without long-term current use of insulin  (HCC) (Primary) - Continue taking glipizide  5 mg two times daily and Mounjaro  10 mg weekly - Previous A1C 5.3% - A1C today 5.4% - Normal POCT Microalbumin -Continue with dietary management - POCT HgB A1C - POCT UA - Microalbumin  2. Essential hypertension -Continue taking amlodipine  10 mg daily, carvedilol  12.5 mg two times daily, and losartan  100 mg daily  3. Other infective acute otitis externa of left ear - Patient to take Augmentin  two time daily x 7 days -Patient to apply 4 drops to the  two times daily for 5-7 days - amoxicillin -clavulanate (AUGMENTIN ) 875-125 MG tablet; Take 1 tablet by mouth 2 (two) times daily.  Dispense: 14 tablet; Refill: 0 - ciprofloxacin -dexamethasone  (CIPRODEX ) OTIC suspension; Place 4 drops into the left ear 2 (two) times daily.  Dispense: 7.5 mL; Refill: 0  4. Acute cough -Recommended trial of over the counter Delsym to help with suppressing cough -Follow up if symptoms does not improve    Return in about 6 months (around 08/21/2024) for Chronic Disease.    Derrek JINNY Freund, NP Student

## 2024-02-22 NOTE — Progress Notes (Signed)
 Acute non-recurrent otitis media of the left ear - On exam, the left TM noted to have a cloudy MEE with swollen canal and exudate present - Plan to treat for otitis media with Augmentin  BID x 7 days  Medical screening examination/treatment was performed by qualified clinical staff member and as supervising provider I was immediately available for consultation/collaboration. I have reviewed documentation and agree with assessment and plan.  Zada FREDRIK Palin, DNP, APRN, FNP-BC [facility] and Sports Medicine

## 2024-02-23 ENCOUNTER — Other Ambulatory Visit (HOSPITAL_COMMUNITY): Payer: Self-pay

## 2024-02-24 ENCOUNTER — Other Ambulatory Visit (HOSPITAL_COMMUNITY): Payer: Self-pay

## 2024-02-24 ENCOUNTER — Other Ambulatory Visit: Payer: Self-pay | Admitting: Medical-Surgical

## 2024-02-24 ENCOUNTER — Other Ambulatory Visit: Payer: Self-pay

## 2024-02-24 DIAGNOSIS — K219 Gastro-esophageal reflux disease without esophagitis: Secondary | ICD-10-CM

## 2024-02-24 MED ORDER — ESOMEPRAZOLE MAGNESIUM 40 MG PO CPDR
40.0000 mg | DELAYED_RELEASE_CAPSULE | Freq: Every day | ORAL | 3 refills | Status: AC
  Filled 2024-02-24: qty 30, 30d supply, fill #0
  Filled 2024-03-23: qty 30, 30d supply, fill #1
  Filled 2024-04-21: qty 30, 30d supply, fill #2

## 2024-02-25 ENCOUNTER — Other Ambulatory Visit: Payer: Self-pay

## 2024-02-28 ENCOUNTER — Other Ambulatory Visit (HOSPITAL_COMMUNITY): Payer: Self-pay

## 2024-03-23 ENCOUNTER — Other Ambulatory Visit: Payer: Self-pay | Admitting: Medical-Surgical

## 2024-03-23 ENCOUNTER — Other Ambulatory Visit: Payer: Self-pay

## 2024-03-23 DIAGNOSIS — E1165 Type 2 diabetes mellitus with hyperglycemia: Secondary | ICD-10-CM

## 2024-03-24 ENCOUNTER — Other Ambulatory Visit: Payer: Self-pay

## 2024-03-24 ENCOUNTER — Other Ambulatory Visit (HOSPITAL_COMMUNITY): Payer: Self-pay

## 2024-03-24 MED ORDER — MOUNJARO 10 MG/0.5ML ~~LOC~~ SOAJ
10.0000 mg | SUBCUTANEOUS | 1 refills | Status: AC
  Filled 2024-03-24: qty 2, 28d supply, fill #0
  Filled 2024-04-06: qty 6, 84d supply, fill #0

## 2024-04-04 ENCOUNTER — Other Ambulatory Visit: Payer: Self-pay

## 2024-04-07 ENCOUNTER — Other Ambulatory Visit (HOSPITAL_COMMUNITY): Payer: Self-pay

## 2024-04-14 ENCOUNTER — Other Ambulatory Visit: Payer: Self-pay

## 2024-04-18 ENCOUNTER — Other Ambulatory Visit: Payer: Self-pay | Admitting: Medical-Surgical

## 2024-04-18 DIAGNOSIS — E1165 Type 2 diabetes mellitus with hyperglycemia: Secondary | ICD-10-CM

## 2024-04-18 MED ORDER — GLIPIZIDE 5 MG PO TABS
5.0000 mg | ORAL_TABLET | Freq: Two times a day (BID) | ORAL | 0 refills | Status: AC
  Filled 2024-04-18: qty 180, 90d supply, fill #0

## 2024-04-19 ENCOUNTER — Other Ambulatory Visit: Payer: Self-pay

## 2024-04-21 ENCOUNTER — Other Ambulatory Visit (HOSPITAL_COMMUNITY): Payer: Self-pay

## 2024-04-25 ENCOUNTER — Other Ambulatory Visit (HOSPITAL_COMMUNITY): Payer: Self-pay

## 2024-04-27 ENCOUNTER — Other Ambulatory Visit: Payer: Self-pay

## 2024-04-28 ENCOUNTER — Other Ambulatory Visit: Payer: Self-pay

## 2024-05-01 ENCOUNTER — Other Ambulatory Visit (HOSPITAL_COMMUNITY): Payer: Self-pay
